# Patient Record
Sex: Female | Born: 1965
Health system: Southern US, Community
[De-identification: ages and names within clinical notes are randomized; demographics above are authoritative.]

## PROBLEM LIST (undated history)

## (undated) DIAGNOSIS — E7800 Pure hypercholesterolemia, unspecified: Secondary | ICD-10-CM

## (undated) DIAGNOSIS — F209 Schizophrenia, unspecified: Secondary | ICD-10-CM

## (undated) DIAGNOSIS — F17200 Nicotine dependence, unspecified, uncomplicated: Secondary | ICD-10-CM

## (undated) HISTORY — PX: BREAST CYST ASPIRATION: SHX578

---

## 2000-08-14 ENCOUNTER — Other Ambulatory Visit: Admission: RE | Admit: 2000-08-14 | Discharge: 2000-08-14 | Payer: Self-pay | Admitting: Family Medicine

## 2000-08-24 ENCOUNTER — Encounter: Payer: Self-pay | Admitting: Family Medicine

## 2000-08-24 ENCOUNTER — Ambulatory Visit (HOSPITAL_COMMUNITY): Admission: RE | Admit: 2000-08-24 | Discharge: 2000-08-24 | Payer: Self-pay | Admitting: Family Medicine

## 2001-01-01 ENCOUNTER — Encounter: Payer: Self-pay | Admitting: Family Medicine

## 2001-01-01 ENCOUNTER — Encounter: Admission: RE | Admit: 2001-01-01 | Discharge: 2001-01-01 | Payer: Self-pay | Admitting: Family Medicine

## 2001-08-25 ENCOUNTER — Observation Stay (HOSPITAL_COMMUNITY): Admission: AC | Admit: 2001-08-25 | Discharge: 2001-08-26 | Payer: Self-pay

## 2001-08-25 ENCOUNTER — Encounter: Payer: Self-pay | Admitting: Emergency Medicine

## 2001-10-30 ENCOUNTER — Inpatient Hospital Stay (HOSPITAL_COMMUNITY): Admission: EM | Admit: 2001-10-30 | Discharge: 2001-11-08 | Payer: Self-pay | Admitting: *Deleted

## 2002-08-29 ENCOUNTER — Inpatient Hospital Stay (HOSPITAL_COMMUNITY): Admission: EM | Admit: 2002-08-29 | Discharge: 2002-09-04 | Payer: Self-pay | Admitting: *Deleted

## 2002-10-18 ENCOUNTER — Inpatient Hospital Stay (HOSPITAL_COMMUNITY): Admission: EM | Admit: 2002-10-18 | Discharge: 2002-10-27 | Payer: Self-pay | Admitting: Psychiatry

## 2003-11-05 ENCOUNTER — Encounter: Payer: Self-pay | Admitting: Emergency Medicine

## 2003-11-06 ENCOUNTER — Inpatient Hospital Stay (HOSPITAL_COMMUNITY): Admission: EM | Admit: 2003-11-06 | Discharge: 2003-11-14 | Payer: Self-pay | Admitting: Psychiatry

## 2004-09-04 ENCOUNTER — Inpatient Hospital Stay (HOSPITAL_COMMUNITY): Admission: EM | Admit: 2004-09-04 | Discharge: 2004-09-13 | Payer: Self-pay | Admitting: Psychiatry

## 2004-09-04 ENCOUNTER — Ambulatory Visit: Payer: Self-pay | Admitting: Psychiatry

## 2006-03-03 ENCOUNTER — Emergency Department (HOSPITAL_COMMUNITY): Admission: EM | Admit: 2006-03-03 | Discharge: 2006-03-04 | Payer: Self-pay | Admitting: Emergency Medicine

## 2006-05-01 ENCOUNTER — Ambulatory Visit: Payer: Self-pay | Admitting: *Deleted

## 2006-05-01 ENCOUNTER — Inpatient Hospital Stay (HOSPITAL_COMMUNITY): Admission: AD | Admit: 2006-05-01 | Discharge: 2006-05-11 | Payer: Self-pay | Admitting: *Deleted

## 2007-01-28 ENCOUNTER — Emergency Department (HOSPITAL_COMMUNITY): Admission: EM | Admit: 2007-01-28 | Discharge: 2007-01-28 | Payer: Self-pay | Admitting: Emergency Medicine

## 2007-07-04 ENCOUNTER — Emergency Department (HOSPITAL_COMMUNITY): Admission: EM | Admit: 2007-07-04 | Discharge: 2007-07-05 | Payer: Self-pay | Admitting: Emergency Medicine

## 2008-12-26 ENCOUNTER — Emergency Department (HOSPITAL_COMMUNITY): Admission: EM | Admit: 2008-12-26 | Discharge: 2008-12-26 | Payer: Self-pay | Admitting: Emergency Medicine

## 2009-10-26 ENCOUNTER — Other Ambulatory Visit: Admission: RE | Admit: 2009-10-26 | Discharge: 2009-10-26 | Payer: Self-pay | Admitting: Family Medicine

## 2010-10-16 ENCOUNTER — Encounter: Payer: Self-pay | Admitting: Family Medicine

## 2011-02-10 NOTE — H&P (Signed)
[facility]  Patient:    [name] Visit Number: [id number] MRN: [id number]          Service Type: PSY Location: 400 [id number] Attending Physician:  Jeanice Lim Dictated by:   Candi Leash. Orsini, N.P. Admit Date:  10/30/2001                     Psychiatric Admission Assessment  DATE OF ADMISSION:  October 30, 2001  PATIENT IDENTIFICATION:  This is a 45 year old single white female voluntarily admitted for psychosis on October 30, 2001.  HISTORY OF PRESENT ILLNESS:  The patient presents with a history of psychotic behavior with increased delusions and positive paranoia.  [name] has moved in with her until she gets better.  The patient reports having "no thoughts," positive paranoid ideation she states especially around African-American people.  Reports she "lives for the Lord."  She denies depression, suicidal or homicidal ideation.  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  She denies any auditory or visual hallucinations.  Sleep and appetite have been fair.  PAST PSYCHIATRIC HISTORY:  First visit to [facility].  Sees Dr. Betti Cruz as an outpatient; phone number [phone].  Sees Maurine Simmering, a counselor at [facility]; phone number [phone].  SUBSTANCE ABUSE HISTORY:  The patient is a nonsmoker.  Denies any alcohol or substance abuse.  PAST MEDICAL HISTORY:  Primary care Arnesha Schiraldi: Dr. Sena Hitch.  Medical problems: None reported.  MEDICATIONS:  Unknown dosages: 1. Risperdal. 2. Topamax. 3. Lexapro.  DRUG ALLERGIES:  No known allergies.  PHYSICAL EXAMINATION:  GENERAL:  The patient presents as an obese, unkempt Caucasian female, 5 feet tall, 259 pounds.  VITAL SIGNS:  Temperature 98, respirations 18, blood pressure 120/77.  LABORATORY DATA:  WBC count elevated mildly at 11.0.  Albumin 3.4.  SOCIAL HISTORY:  A 45 year old single white female.  She has a  34 year old child.  She lives alone with her child.  She is unemployed, no work since 1993.  FAMILY HISTORY:  Unknown.  MENTAL STATUS EXAMINATION:  She is an alert, obese white female, unkempt, cooperative, guarded.  Speech is soft spoken with some thought blocking. Mood: Somewhat euphoric.  Affect is blunt.  Thought processes: Appears to be responding to internal stimuli, positive paranoia, positive religious preoccupation, positive delusions.  Cognitive: Intact.  Oriented to surroundings but unsure of the day of the week.  Judgment is poor.  Insight is poor.  ADMISSION DIAGNOSES: Axis I:    1. Psychosis, not otherwise specified.            2. Rule out schizophrenia. Axis II:   Deferred. Axis III:  None. Axis IV:   Moderate related to other psychosocial problems. Axis V:    Current is 25, estimated this past year is 55-60.  INITIAL PLAN OF CARE:  Plan is a voluntary admission to [facility] for psychosis.  Contract for safety.  Check every 15 minutes.  The patient will be placed on the 400 Hall for close monitoring.  Will resume her routine medications.  Will obtain labs.  Treatment plan was discussed with Dr. Kathrynn Running.  Will obtain CBC and consider placing the patient on Clozaril. Contact [name] for background information.  Goal is to stabilize mood and thinking so the patient can be safe, to follow up with Dr. Betti Cruz, be medication compliant.  ESTIMATED LENGTH OF STAY:  Five days or more depending on the  patients response to medications. Dictated by:   Candi Leash. Orsini, N.P. Attending Physician:  Jeanice Lim DD:  11/05/01 TD:  11/05/01 Job: [id number]
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
## 2011-02-10 NOTE — Discharge Summary (Signed)
NAMEJULETTA, [name]                           ACCOUNT NO.:  [id number]   MEDICAL RECORD NO.:  [id number]                   PATIENT TYPE:  IPS   LOCATION:  0403                                 FACILITY:  [facility]   PHYSICIAN:  Jeanice Lim, M.D.              DATE OF BIRTH:  Jun 15, 1966   DATE OF ADMISSION:  08/29/2002  DATE OF DISCHARGE:  09/04/2002                                 DISCHARGE SUMMARY   IDENTIFYING DATA:  This is a 45 year old single Caucasian female voluntarily  admitted with a history of self-inflicting injuries reporting to her  psychiatrist that she felt increased paranoid thoughts.  She had a past  history of schizophrenia.  She has been followed up by Dr. Betti Cruz.  Received  IM Prolixin.  Was ambivalent about these treatments and was quite delusional  at the time of admission.   MEDICATIONS:  Prolixin Decanoate every two weeks 25 mg and Cogentin 1 mg  b.i.d. and albuterol inhalers.   ALLERGIES:  No known drug allergies.   PHYSICAL EXAMINATION:  Essentially within normal limits.  Vital signs  stable.  Afebrile.  Neurologically nonfocal and intact.   LABORATORY DATA:  CBC within normal limits.  CMET within normal limits.   MENTAL STATUS EXAM:  Alert, young, middle-aged, cooperative female.  Guarded, duly paranoid with some thought-blocking, had somatic delusions,  appearing to respond to internal stimulus.  Denied suicidal or homicidal  ideation.  Cognitively intact.  Judgment and insight poor.   ADMISSION DIAGNOSES:   AXIS I:  Schizophrenia, paranoid-type.   AXIS II:  None.   AXIS III:  Asthma.   AXIS IV:  Moderate (problems with primary support group and limited  understanding into chronic mental illness).   AXIS V:  30/55.   HOSPITAL COURSE:  The patient was admitted and ordered routine p.r.n.  medications and underwent further monitoring.  The patient was encouraged to  participate in individual, group and milieu therapy.  Was resumed on  psychotropics and added Risperdal targeting psychotic symptoms.  Was  titrated, which patient tolerated well and she felt that her thinking became  more clear.  There was no longer preoccupation with delusional thoughts and  patient became less guarded and paranoid.  She participated in a family  meeting.  Aftercare planning was confirmed including intensive outpatient  follow-up.   CONDITION ON DISCHARGE:  Markedly improved.  Mood more euthymic.  Affect  brighter.  Thought processes goal directed.  Thought content negative for  dangerous ideation or psychotic symptoms.  The patient reported motivation  to be compliant with medications and there was no dangerous ideation at the  time of discharge.   DISCHARGE MEDICATIONS:  1. Cogentin 1 mg b.i.d.  2. Prolixin Decanoate 25 mg IM every 14 days.  3. Risperdal 3 mg q.h.s.  4. Ambien 10 mg q.h.s. p.r.n. insomnia.   FOLLOW UP:  Dr. Betti Cruz on September 22, 2002 and to follow up with IOP Monday,  September 08, 2002 at 9 a.m.   DISCHARGE DIAGNOSES:   AXIS I:  Schizophrenia, paranoid-type.   AXIS II:  None.   AXIS III:  Asthma.   AXIS IV:  Moderate (problems with primary support group and limited  understanding into chronic mental illness).   AXIS V:  Global Assessment of Functioning on discharge 50-55.                                               Jeanice Lim, M.D.    JEM/MEDQ  D:  09/05/2002  T:  09/06/2002  Job:  [id number]

## 2011-02-10 NOTE — H&P (Signed)
NAMECYNTHYA, [name]                           ACCOUNT NO.:  [id number]   MEDICAL RECORD NO.:  [id number]                   PATIENT TYPE:  IPS   LOCATION:  0403                                 FACILITY:  [facility]   PHYSICIAN:  Jeanice Lim, M.D.              DATE OF BIRTH:  13-Oct-1965   DATE OF ADMISSION:  08/29/2002  DATE OF DISCHARGE:                         PSYCHIATRIC ADMISSION ASSESSMENT   IDENTIFYING INFORMATION:  A 45 year old single white female, voluntarily  admitted on August 29, 2002.   HISTORY OF PRESENT ILLNESS:  The patient presents with a history of self-  inflicted injuries.  She cut her wrist last week, states she did it to see  how much cutting she could do before it would hurt.  She apparently had  told her psychiatrist who advised admission for evaluation.  The patient  reports that she has been experiencing increased paranoid thoughts prior to  this admission, but then states she went to church and got hope.  She  denies any specific stressors.  She states her appetite has been  satisfactory, has been compliant with her medication and feels that she does  not need to be here.   PAST PSYCHIATRIC HISTORY:  First admission to [facility].  Sees Dr. Betti Cruz and Kenton Kingfisher who is her therapist.   SOCIAL HISTORY:  She is a 45 year old single white female.  She lives with  her father and her son.  She is on disability.  She reports no legal  problems.   FAMILY HISTORY:  Unknown.   ALCOHOL DRUG HISTORY:  Denies any drug or alcohol use.   PAST MEDICAL HISTORY:  Primary care Graciela Plato is Dr. Arvilla Market.  Medical  problems are none.   MEDICATIONS:  Prolixin Decanoate 1 cc every 2 weeks, gets her injections on  Wednesday, Cogentin 1 mg b.i.d. and albuterol inhalers.   DRUG ALLERGIES:  No known allergies.   PHYSICAL EXAMINATION:  The patient's vital signs:  97, 77 heart rate, 20  respirations, blood pressure is 125/78.  GENERAL:  The patient  is a  45 year old Caucasian female in no acute  distress.  HEAD:  Normocephalic.  Hair is long, brown and clean.  Able to protrude her  tongue without tremor.  Able to raise her eyebrows.  No sinus tenderness or  nasal discharge.  Dentition is fair.  NECK:  Negative lymphadenopathy.  CHEST:  Clear to auscultation.  HEART:  Regular rate and rhythm.  BREAST EXAM:  Deferred.  ABDOMEN:  Soft, nontender abdomen.  No CVA tenderness.  MUSCULOSKELETAL:  Muscle strength and tone is equal bilaterally.  No signs  of injury.  SKIN:  Warm and dry.  The patient has a laceration to her left wrist which  is about 1 inch long, no drainage noted.  NEUROLOGIC:  Oriented x3.  Cranial nerves are grossly intact.  The patient  is able to perform heel-to-shin and  normal alternating movements without  difficulty.   LABORATORY DATA:  CBC is 11.1, RBC is 5.38, hemoglobin is 16.1, hematocrit  47.4.  CMET is within normal limits.   MENTAL STATUS EXAM:  She is an alert, young middle-aged cooperative female,  guarded, offered little information and nonspecific information.  Speech is  thought blocking.  Her affect is constricted.  Thought processes:  The  patient appears to be responding to internal stimuli.  No visual  hallucinations or paranoid ideation, no suicidal or homicidal ideation.  Cognitive functioning intact.  Memory is poor, judgment and insight are  poor.  The patient is a poor and questionable historian.   ADMISSION DIAGNOSES:   AXIS I:  Schizophrenia, paranoid type.   AXIS II:  Deferred.   AXIS III:  Asthma.   AXIS IV:  Problems with primary support group and other psychosocial  problems.   AXIS V:  Current is 30, this past year 55-60.   PLAN:  Voluntary admission for self-inflicted injury.  Contract for safety,  check every 15 minutes.  The patient will be placed on the 400 hall for  close monitoring.  We will resume her medications, will add Risperdal and  Zyprexa for psychotic symptoms.   Will contact family for background  information.  Will observe the wound for signs and symptoms of infection.  The patient to be medication compliant.  To stabilize her mood and thinking  so the patient can be safe, to follow up with Dr. Betti Cruz and Kenton Kingfisher,  therapist.   TENTATIVE LENGTH OF CARE:  4-6 days.       Landry Corporal, N.P.                       Jeanice Lim, M.D.    JO/MEDQ  D:  09/02/2002  T:  09/02/2002  Job:  [id number]

## 2011-02-10 NOTE — H&P (Signed)
[name], [name]                           ACCOUNT NO.:  [id number]   MEDICAL RECORD NO.:  [id number]                   PATIENT TYPE:  IPS   LOCATION:  0406                                 FACILITY:  [facility]   PHYSICIAN:  Jeanice Lim, M.D.              DATE OF BIRTH:  1966-01-11   DATE OF ADMISSION:  10/18/2002  DATE OF DISCHARGE:                         PSYCHIATRIC ADMISSION ASSESSMENT   PATIENT IDENTIFICATION:  This is a 45 year old single white female with a 17-  year-old son who was voluntarily admitted.   HISTORY OF PRESENT ILLNESS:  She is expressing increasing suicidal ideation  with a plan to walk in front of a truck.  The patient was brought to the  [facility] Emergency Room yesterday by a neighbor after telling  the neighbor she had these ideas.  The patient, today, reports becoming  despondent after her father told her he was going to have to go out of town  for work and is going to leave her brother with her and her son.  The  patient spontaneously reports planning to overdose on pills at age 61, is  frequently thought blocking and tangential.   PAST PSYCHIATRIC HISTORY:  She has had two prior [facility] admissions, one in February 2003, one in December 2003.  She  acknowledges beginning treatment for mental illness at age 21.   SUBSTANCE ABUSE HISTORY:  She denies any issues with alcohol or drugs.   PAST MEDICAL HISTORY:  Primary care Nain Rudd is Donia Guiles, M.D.  She  has medical conditions of allergies and asthma.  She is given Allegra for  her allergies and albuterol inhaler for her asthma.   MEDICATIONS:  She is currently under the care of Daine Floras, M.D.  She receives:  1. Prolixin Decanoate 25 mg every 14 days.  She is next due for such on     October 29, 2002.  2. Risperdal 3 mg at h.s.  3. Cogentin 1 mg b.i.d.  4. Ambien 10 mg p.r.n.   DRUG ALLERGIES:  The patient denies any drug allergies.   PHYSICAL EXAMINATION:  GENERAL:  The patient is obese; otherwise, she had no  remarkable physical findings.   SOCIAL HISTORY:  She states that she tried to attend several colleges and  also went to [employer].  She was found to be disabled in 1993.  Her father and son live with her in an apartment.   FAMILY HISTORY:  She says that her 47 year old son has an understanding  problem.  Her father gets depressed about her condition but otherwise,  disavows any knowledge of other family members with mental illness.   MENTAL STATUS EXAM:  She was obese.  She was casual.  She was calm.  Her  mood and affect were flat.  She had poor eye contact.  Her speech was slow.  She could be  redirected and answer appropriately.  She was somewhat sedated.  She was also exhibiting thought blocking.  She was, however, alert and  oriented x 3.  She could show that her thoughts were clear and goal oriented  at times.  Judgment and insight were poor.    ADMISSION DIAGNOSES:   AXIS I:  Paranoid schizophrenia; probable noncompliance with p.o.  medications.   AXIS II:  Deferred.   AXIS III:  1. Obesity.  2. Asthma.   AXIS IV:  She denies.   AXIS V:  Her global assessment of functioning at the moment is about 35 and  perhaps, in the past year has been as good as 60.   INITIAL PLAN OF CARE:  The plan is to treat her suicidal ideation and  hallucinations and to help her comply with her p.o. medications.   ESTIMATED LENGTH OF STAY:  Tentative length of stay is three to four days.     Jeanice Lim, M.D.                    Jeanice Lim, M.D.    JEM/MEDQ  D:  10/19/2002  T:  10/19/2002  Job:  [id number]

## 2011-02-10 NOTE — Discharge Summary (Signed)
[name], [name]                           ACCOUNT NO.:  [id number]   MEDICAL RECORD NO.:  [id number]                   PATIENT TYPE:  IPS   LOCATION:  0406                                 FACILITY:  [facility]   PHYSICIAN:  Jeanice Lim, M.D.              DATE OF BIRTH:  [birth year]   DATE OF ADMISSION:  10/18/2002  DATE OF DISCHARGE:  10/27/2002                                 DISCHARGE SUMMARY   IDENTIFYING DATA:  This is a 45 year old single Caucasian female with a 74-  year-old son voluntarily admitted with increasing suicidal ideation with  plan to walk in front of a truck.   MEDICATIONS:  Prolixin Decanoate, Risperdal, Cogentin and Ambien.   ALLERGIES:  No known drug allergies.   PHYSICAL EXAMINATION:  Essentially within normal limits.  Neurologically  nonfocal.   LABORATORY DATA:  Routine admission labs essentially within normal limits.   MENTAL STATUS EXAM:  Obese, mostly calm female.  Mood and affect were flat,  guarded.  The patient was somewhat sedated and exhibiting thought-blocking.  Otherwise cognition was intact.  Judgment and insight were poor.   ADMISSION DIAGNOSES:   AXIS I:  Paranoid schizophrenia, chronic, with a long history of  noncompliance with medications.   AXIS II:  None.   AXIS III:  1. Obesity.  2. Asthma.   AXIS IV:  Moderate (problems with primary support group).   AXIS V:  35/60.   HOSPITAL COURSE:  The patient was admitted and ordered routine p.r.n.  medications and underwent further monitoring.  Was encouraged to participate  in individual, group and milieu therapy.  The patient was resumed on  Cogentin, Prolixin, Risperdal, Ambien, albuterol inhaler and started on  Lexapro and Risperdal p.o.  Lexapro was optimized targeting depressive  symptoms.  The patient had a family meeting to discuss compliance issues and  evaluate support system.  The patient was further optimized on p.o.  Risperdal and given Prolixin Decanoate prior to  discharge.  She reported  positive response to clinical intervention.   CONDITION ON DISCHARGE:  Improved.  Mood was more euthymic.  Affect  brighter.  Thought processes goal directed.  Thought content negative for  dangerous ideation or psychotic symptoms.  The patient reported motivation  to be compliant with the aftercare plan.    DISCHARGE MEDICATIONS:  1. Prolixin Decanoate 25 mg every two weeks.  Last given on October 27, 2002.  2. Cogentin 1 mg b.i.d.  3. Risperdal 0.5 mg t.i.d. and 3 mg q.h.s.  4. Lexapro 10 mg q.a.m.   FOLLOW UP:  The patient was to follow up with psychiatric counseling on  November 04, 2002 at 1:30 p.m. with  Dr. Betti Cruz and with therapist on November 11, 2002.   DISCHARGE DIAGNOSES:   AXIS I:  Paranoid schizophrenia, chronic, with a long history of  noncompliance with medications.   AXIS II:  None.   AXIS III:  1. Obesity.  2. Asthma.   AXIS IV:  Moderate (problems with primary support group).   AXIS V:  Global Assessment of Functioning on discharge 50.                                               Jeanice Lim, M.D.    JEM/MEDQ  D:  11/26/2002  T:  11/27/2002  Job:  [id number]
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
## 2011-07-06 LAB — DIFFERENTIAL
Basophils Relative: 1
Eosinophils Absolute: 0.2
Neutro Abs: 8.8 — ABNORMAL HIGH
Neutrophils Relative %: 70

## 2011-07-06 LAB — RAPID URINE DRUG SCREEN, HOSP PERFORMED
Amphetamines: NOT DETECTED
Barbiturates: NOT DETECTED
Benzodiazepines: NOT DETECTED
Cocaine: NOT DETECTED
Opiates: NOT DETECTED
Tetrahydrocannabinol: NOT DETECTED

## 2011-07-06 LAB — URINALYSIS, ROUTINE W REFLEX MICROSCOPIC
Glucose, UA: NEGATIVE
Ketones, ur: NEGATIVE
Leukocytes, UA: NEGATIVE
Nitrite: NEGATIVE
Protein, ur: NEGATIVE
Urobilinogen, UA: 0.2

## 2011-07-06 LAB — CBC
HCT: 44.7
Hemoglobin: 15.2 — ABNORMAL HIGH
MCHC: 34
RBC: 5.4 — ABNORMAL HIGH

## 2011-07-06 LAB — URINE MICROSCOPIC-ADD ON

## 2011-07-06 LAB — COMPREHENSIVE METABOLIC PANEL
ALT: 23
Alkaline Phosphatase: 65
BUN: 9
CO2: 22
Calcium: 8.4
GFR calc non Af Amer: >60
Glucose, Bld: 102 — ABNORMAL HIGH
Sodium: 132 — ABNORMAL LOW

## 2011-12-21 ENCOUNTER — Other Ambulatory Visit: Payer: Self-pay | Admitting: Family Medicine

## 2011-12-21 DIAGNOSIS — Z1231 Encounter for screening mammogram for malignant neoplasm of breast: Secondary | ICD-10-CM

## 2012-01-23 ENCOUNTER — Ambulatory Visit
Admission: RE | Admit: 2012-01-23 | Discharge: 2012-01-23 | Disposition: A | Discharge status: Home / Self Care | Payer: Medicaid Other | Source: Ambulatory Visit | Attending: Family Medicine | Admitting: Family Medicine

## 2012-01-23 DIAGNOSIS — Z1231 Encounter for screening mammogram for malignant neoplasm of breast: Secondary | ICD-10-CM

## 2012-03-10 ENCOUNTER — Emergency Department (HOSPITAL_COMMUNITY)
Admission: EM | Admit: 2012-03-10 | Discharge: 2012-03-11 | Disposition: A | Discharge status: Home / Self Care | Payer: Medicare Other | Attending: Emergency Medicine | Admitting: Emergency Medicine

## 2012-03-10 ENCOUNTER — Encounter (HOSPITAL_COMMUNITY): Payer: Self-pay | Admitting: *Deleted

## 2012-03-10 DIAGNOSIS — F200 Paranoid schizophrenia: Secondary | ICD-10-CM

## 2012-03-10 HISTORY — DX: Schizophrenia, unspecified: F20.9

## 2012-03-10 HISTORY — DX: Pure hypercholesterolemia, unspecified: E78.00

## 2012-03-10 HISTORY — DX: Nicotine dependence, unspecified, uncomplicated: F17.200

## 2012-03-10 LAB — ETHANOL: Alcohol, Ethyl (B): <11 mg/dL (ref 0–11)

## 2012-03-10 LAB — CBC
Platelets: 218 10*3/uL (ref 150–400)
RDW: 13.5 % (ref 11.5–15.5)
WBC: 9.5 10*3/uL (ref 4.0–10.5)

## 2012-03-10 LAB — COMPREHENSIVE METABOLIC PANEL
ALT: 14 U/L (ref 0–35)
AST: 18 U/L (ref 0–37)
CO2: 23 mEq/L (ref 19–32)
Calcium: 9.2 mg/dL (ref 8.4–10.5)
Chloride: 101 mEq/L (ref 96–112)
GFR calc non Af Amer: 85 mL/min — ABNORMAL LOW (ref >90)
Potassium: 3.5 mEq/L (ref 3.5–5.1)
Sodium: 136 mEq/L (ref 135–145)
Total Bilirubin: 0.3 mg/dL (ref 0.3–1.2)

## 2012-03-10 LAB — DIFFERENTIAL
Basophils Absolute: 0 10*3/uL (ref 0.0–0.1)
Lymphocytes Relative: 19 % (ref 12–46)
Neutro Abs: 7 10*3/uL (ref 1.7–7.7)
Neutrophils Relative %: 74 % (ref 43–77)

## 2012-03-10 MED ORDER — NICOTINE 21 MG/24HR TD PT24
21.0000 mg | MEDICATED_PATCH | Freq: Every day | TRANSDERMAL | Status: DC
  Administered 2012-03-10 – 2012-03-11 (×2): 21 mg via TRANSDERMAL
  Filled 2012-03-10 (×2): qty 1

## 2012-03-10 MED ORDER — RISPERIDONE 2 MG PO TABS
6.0000 mg | ORAL_TABLET | Freq: Every day | ORAL | Status: DC
  Administered 2012-03-10: 6 mg via ORAL
  Filled 2012-03-10: qty 3

## 2012-03-10 MED ORDER — FLUPHENAZINE DECANOATE 25 MG/ML IJ SOLN: 25.0000 mg | Freq: Once | INTRAMUSCULAR | Status: DC

## 2012-03-10 MED ORDER — ZIPRASIDONE MESYLATE 20 MG IM SOLR
20.0000 mg | Freq: Once | INTRAMUSCULAR | Status: AC
  Administered 2012-03-10: 20 mg via INTRAMUSCULAR
  Filled 2012-03-10: qty 20

## 2012-03-10 MED ORDER — ACETAMINOPHEN 325 MG PO TABS: 650.0000 mg | ORAL_TABLET | ORAL | Status: DC | PRN

## 2012-03-10 MED ORDER — LORAZEPAM 1 MG PO TABS: 1.0000 mg | ORAL_TABLET | Freq: Three times a day (TID) | ORAL | Status: DC | PRN

## 2012-03-10 NOTE — ED Notes (Addendum)
Information per [name] at group home-phone # [phone].   Pt has a guardian at [employer]  Per Ms [name] the patient has been at the group home since 2009 and has been able to function well.  She reports that the pt always has a degree of delusional behaviors that fluctuate .  She reports that the patients brother died several months ago and that the patients behavior started deteriorating and she  has become more hostile/agressive/angry/delusional and that the episodes are lasting  Longer..  The patients MD is aware and added po prolixin to her medication which the patient has refused to take.  The patient is getting the prolixin injections and does take her risperidal at night.She also reports that the patient does not do well when she goes to her home for visits and that they have been limiting her visits to several hours on the week ends.  Ms [name] is requesting that the patient be sent to [facility].  She reported that prior to 2009 the patient was at [facility] for about 1 yr and that they were able to get the patients medication adjusted.  Ms [name] requested to be updated with the patients disposition.
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
## 2012-03-10 NOTE — ED Notes (Signed)
Up to the bathroom, ambulatory w/o difficulty 

## 2012-03-10 NOTE — ED Notes (Signed)
Pt refuses to change into scrubs stating "I'm not staying here, I'm not taking my clothes off, there's nothing wrong with me"; [employer] remains @ BS.

## 2012-03-10 NOTE — ED Notes (Signed)
Sitting on the side of the bed eatting, nad

## 2012-03-10 NOTE — ED Notes (Signed)
Pt placed in scrubs and wanded by security. Pt escorted to psych with security and [employer]. Pt has belonging bag x1
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
## 2012-03-11 DIAGNOSIS — F200 Paranoid schizophrenia: Secondary | ICD-10-CM

## 2012-03-11 LAB — RAPID URINE DRUG SCREEN, HOSP PERFORMED
Barbiturates: NOT DETECTED
Benzodiazepines: NOT DETECTED

## 2012-03-11 LAB — URINALYSIS, ROUTINE W REFLEX MICROSCOPIC
Ketones, ur: NEGATIVE mg/dL
Leukocytes, UA: NEGATIVE
Nitrite: NEGATIVE
pH: 6 (ref 5.0–8.0)

## 2012-03-11 LAB — URINE MICROSCOPIC-ADD ON

## 2012-03-11 MED ORDER — FLUPHENAZINE DECANOATE 25 MG/ML IJ SOLN
25.0000 mg | Freq: Once | INTRAMUSCULAR | Status: AC
  Administered 2012-03-11: 25 mg via INTRAMUSCULAR
  Filled 2012-03-11 (×2): qty 1

## 2012-03-11 NOTE — Discharge Instructions (Signed)
[name] was evaluated by telepsych at the [facility].  It is safe to send her back to the Group Home with no change of medications.  Follow up with PCP/psych  this week.  Schizophrenia Schizophrenia is a serious mental illness. There is disturbed and disorganized thinking, language, and behavior. Patients may see, hear, or feel things that are not really there. Sometimes speech is incoherent and there are multiple problems with day to day living. Schizophrenia should not be confused with multiple personality disorder (now called dissociative identity disorder) in which a person has at least two distinct personalities. About 1% of people have schizophrenia in their lifetimes. It affects men and women equally. CAUSES  There are many theories about the cause of schizophrenia. The genes a person inherits from his or her parents may be partially responsible. Stress in a person's environment can trigger episodes. A person may have functioned normally for years and then have an acute (sudden) psychotic episode caused by stress. Some scientists believe that something might happen before birth, such as a viral infection in the womb, that causes schizophrenic symptoms (problems) decades later. Special scans, such as PET (positron-emission tomography) have been used to look at the brains of people with this illness. These pictures show that some parts of the brain seem to have metabolic or chemical abnormalities. Lab studies have shown that nerve cells in some parts of the brains of schizophrenics may be uneven or damaged. Another possible cause is that chemicals carrying signals between nerve cells may not be working. Schizophrenia does not appear to be caused by family problems. Stress does appear to make things worse for people with this illness. SYMPTOMS No single symptom defines this condition. Important signs are:  Hallucinations (hearing voices or seeing things that are not there to hear or see).    Dressing inappropriately.   Neglecting personal hygiene and grooming.   Withdrawing from social contacts and not speaking to anybody (autism).   Inability to understand what a schizophrenic is saying.   A growing distrust of people without good cause.   Being very bland or blunted emotionally (flat affect).  TYPES OF SCHIZOPHRENIA  Paranoid schizophrenia involves delusional thoughts. The patient believes people around them are against them and plotting against them.   In grandiose schizophrenia the patient may feel that he is God, or the President, etc.   Disorganized schizophrenia involves symptoms of disorganized speech.  TREATMENT  Medications are the most important part of the treatment of schizophrenia. Many medications are available that can relieve symptoms. It is often helpful if these can be administered by a more trusted family member because the patient may sometimes think they are being poisoned. It is important that the medications be given regularly even when the patient seems to be doing well. Do not stop giving medications without instruction by a caregiver. This could lead to a relapse. Hospitalization may sometimes be necessary if symptoms cannot be controlled with medications. Schizophrenia may be lifelong. However, periods of illness may be inter spaced with long periods of normality. Medications can greatly improve the quality of life. Non prescribed drugs and alcohol should be avoided.  Assistance is available for care. The [employer] is an organization of family members of people with severe mental illness. They direct families and patients to support groups, education, and advocacy programs for additional help. [employer]) toll-free help line number is 800/950-NAMI, or 213-451-6582. Document Released: 09/08/2000 Document  Revised: 08/31/2011 Document Reviewed: 09/11/2005 [facility] Patient Information  2012 [place], [place].
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
## 2012-03-11 NOTE — Progress Notes (Signed)
Discussed case with RN.  Per RN, Pt has been cleared by psych and MD for d/c today.  CSW met with Pt to discuss d/c plan.  Pt was labile and had difficulty remaining on topic.  Pt states repeatedly that she does not want to return to her group home, as they are trying to kill her with medication.  She states that she only wants to take a fish oil pill but that the home insists that she take other meds.  CSW attempted to call Pt's mom, as Pt stated that her mom will allow her to return.  Unable to leave a message, as "mailbox full."  CSW called [name] at [facility] group home.  Mrs. [name] stating that the group home will not accept Pt unless she verbally agrees to take her medication.  Mrs. [name] asked that CSW not use the word "take" when talking with Pt about her meds, rather, Pt responds to "swallow."  CSW discussed with Pt Mrs. Allen's concern about Pt swallowing her medications.  Pt became upset and stated that she's not going to take anything other that fish oil.  Further, Pt stated that she's not going back to Mrs. Allen's care.    CSW to attempt to call DSS guardian.  CSW to continue to follow.  Providence Crosby, LCSWA Clinical Social Work [phone]

## 2012-03-11 NOTE — ED Notes (Signed)
Pt refused to sign discharge papers 
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
## 2012-03-11 NOTE — Progress Notes (Addendum)
CSW was consulted by unit psychiatrist to assist in facilitating pt's return to her group home. Per unit psychiatrist, pt has now received 2 psychiatry consults both of which recommend discharge home and noting that there is no criteria for inpatient hospitalization.   CSW contacted [name] [phone]), [facility] owner, who expressed concerns regarding pt's behaviors. [name] stated that she feels as though the pt is not at her baseline and is concerned with her returning to the group home, though is not stating that the pt cannot return home. Allen requested to speak with the psychiatrist who recommended discharge. CSW spoke with Dr. Carmelina Dane and requested that he talk with [name] at the group home.   CSW also contacted Roxana Hires [phone]), pt's DSS guardian, to discuss discharge plans. At this time, guardian expressed concerns that the pt is not at her baseline though understands that if psychiatrists have evaluated her and recommend discharge home, she will need to be discharged. Reuel Derby supports the pt's discharge back to the group home if pt is discharged this evening.   CSW also spoke with Court Trueblood [phone]), [facility] ACT team member, who stated that he has worked with the pt for a number of years and she has some aggressive behaviors even at baseline. Court requested to speak with the psychiatrist regarding recommendations. CSW was able to transfer the call to Dr. Carmelina Dane, unit psychiatrist.   CSW spoke with unit psychiatrist and EDP regarding community member's concerns with pt's discharge. Per EDP and unit psychiatrist, pt was still to be discharged home.
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
## 2012-03-11 NOTE — Progress Notes (Signed)
CSW spoke with [name] who is willing to accept pt back at this time. Allen requested that she receives her Prolixin  before discharge. EDP was willing to provide the injection of Prolixin but no the PRN med. [name] was updated and still agrees the pt is able to discharge back however if unable to provide transportation.   CSW contacted Court with [facility] who states that he will provide the pt with transportation and will arrive between 7:30-8:00pm.

## 2012-03-12 NOTE — ED Provider Notes (Signed)
Medical screening examination/treatment/procedure(s) were performed by non-physician practitioner and as supervising physician I was immediately available for consultation/collaboration.  Toy Baker, MD 03/12/12 [phone]

## 2012-06-05 NOTE — ED Provider Notes (Signed)
Medical screening examination/treatment/procedure(s) were performed by non-physician practitioner and as supervising physician I was immediately available for consultation/collaboration.  Toy Baker, MD 06/05/12 2023

## 2013-02-11 ENCOUNTER — Other Ambulatory Visit: Payer: Self-pay | Admitting: Family Medicine

## 2013-02-11 DIAGNOSIS — Z1231 Encounter for screening mammogram for malignant neoplasm of breast: Secondary | ICD-10-CM

## 2013-02-14 ENCOUNTER — Ambulatory Visit: Payer: Medicare Other

## 2013-02-19 ENCOUNTER — Ambulatory Visit
Admission: RE | Admit: 2013-02-19 | Discharge: 2013-02-19 | Disposition: A | Discharge status: Home / Self Care | Payer: Medicare Other | Source: Ambulatory Visit | Attending: Family Medicine | Admitting: Family Medicine

## 2013-02-19 DIAGNOSIS — Z1231 Encounter for screening mammogram for malignant neoplasm of breast: Secondary | ICD-10-CM

## 2014-04-02 ENCOUNTER — Other Ambulatory Visit: Payer: Self-pay

## 2014-04-02 ENCOUNTER — Other Ambulatory Visit: Payer: Self-pay | Admitting: Family Medicine

## 2014-04-02 DIAGNOSIS — Z1231 Encounter for screening mammogram for malignant neoplasm of breast: Secondary | ICD-10-CM

## 2014-04-16 ENCOUNTER — Ambulatory Visit
Admission: RE | Admit: 2014-04-16 | Discharge: 2014-04-16 | Disposition: A | Discharge status: Home / Self Care | Payer: Medicare Other | Source: Ambulatory Visit | Attending: Family Medicine | Admitting: Family Medicine

## 2014-04-16 DIAGNOSIS — Z1231 Encounter for screening mammogram for malignant neoplasm of breast: Secondary | ICD-10-CM

## 2015-06-17 ENCOUNTER — Other Ambulatory Visit: Payer: Self-pay

## 2015-06-17 DIAGNOSIS — Z1231 Encounter for screening mammogram for malignant neoplasm of breast: Secondary | ICD-10-CM

## 2015-06-22 ENCOUNTER — Ambulatory Visit
Admission: RE | Admit: 2015-06-22 | Discharge: 2015-06-22 | Disposition: A | Discharge status: Home / Self Care | Payer: Medicaid Other | Source: Ambulatory Visit

## 2015-06-22 DIAGNOSIS — Z1231 Encounter for screening mammogram for malignant neoplasm of breast: Secondary | ICD-10-CM

## 2015-06-25 ENCOUNTER — Other Ambulatory Visit: Payer: Self-pay

## 2015-06-25 ENCOUNTER — Ambulatory Visit
Admission: RE | Admit: 2015-06-25 | Discharge: 2015-06-25 | Disposition: A | Discharge status: Home / Self Care | Payer: Medicaid Other | Source: Ambulatory Visit

## 2015-06-25 ENCOUNTER — Ambulatory Visit: Payer: Medicaid Other

## 2015-06-25 DIAGNOSIS — Z1231 Encounter for screening mammogram for malignant neoplasm of breast: Secondary | ICD-10-CM

## 2015-10-20 ENCOUNTER — Other Ambulatory Visit: Payer: Self-pay | Admitting: Family Medicine

## 2015-10-20 ENCOUNTER — Other Ambulatory Visit (HOSPITAL_COMMUNITY)
Admission: RE | Admit: 2015-10-20 | Discharge: 2015-10-20 | Disposition: A | Discharge status: Home / Self Care | Payer: Medicare Other | Source: Ambulatory Visit | Attending: Family Medicine | Admitting: Family Medicine

## 2015-10-20 DIAGNOSIS — Z113 Encounter for screening for infections with a predominantly sexual mode of transmission: Secondary | ICD-10-CM | POA: Diagnosis present

## 2015-10-20 DIAGNOSIS — Z1151 Encounter for screening for human papillomavirus (HPV): Secondary | ICD-10-CM | POA: Insufficient documentation

## 2015-10-20 DIAGNOSIS — N760 Acute vaginitis: Secondary | ICD-10-CM | POA: Insufficient documentation

## 2015-10-20 DIAGNOSIS — Z124 Encounter for screening for malignant neoplasm of cervix: Secondary | ICD-10-CM | POA: Diagnosis present

## 2015-10-25 LAB — CYTOLOGY - PAP

## 2015-11-29 ENCOUNTER — Emergency Department (HOSPITAL_COMMUNITY)
Admission: EM | Admit: 2015-11-29 | Discharge: 2015-11-30 | Payer: Medicare Other | Attending: Emergency Medicine | Admitting: Emergency Medicine

## 2015-11-29 ENCOUNTER — Encounter (HOSPITAL_COMMUNITY): Payer: Self-pay | Admitting: Emergency Medicine

## 2015-11-29 DIAGNOSIS — F911 Conduct disorder, childhood-onset type: Secondary | ICD-10-CM | POA: Diagnosis not present

## 2015-11-29 DIAGNOSIS — F22 Delusional disorders: Secondary | ICD-10-CM | POA: Insufficient documentation

## 2015-11-29 DIAGNOSIS — F17200 Nicotine dependence, unspecified, uncomplicated: Secondary | ICD-10-CM | POA: Diagnosis not present

## 2015-11-29 DIAGNOSIS — Z79899 Other long term (current) drug therapy: Secondary | ICD-10-CM | POA: Insufficient documentation

## 2015-11-29 DIAGNOSIS — Z008 Encounter for other general examination: Secondary | ICD-10-CM | POA: Diagnosis present

## 2015-11-29 DIAGNOSIS — E7800 Pure hypercholesterolemia, unspecified: Secondary | ICD-10-CM | POA: Insufficient documentation

## 2015-11-29 LAB — CBC
HEMATOCRIT: 45.9 % (ref 36.0–46.0)
HEMOGLOBIN: 15.2 g/dL — AB (ref 12.0–15.0)
MCH: 30 pg (ref 26.0–34.0)
MCHC: 33.1 g/dL (ref 30.0–36.0)
MCV: 90.7 fL (ref 78.0–100.0)
Platelets: 185 10*3/uL (ref 150–400)
RBC: 5.06 MIL/uL (ref 3.87–5.11)
RDW: 13.7 % (ref 11.5–15.5)
WBC: 9.2 10*3/uL (ref 4.0–10.5)

## 2015-11-29 LAB — COMPREHENSIVE METABOLIC PANEL
ALBUMIN: 4.3 g/dL (ref 3.5–5.0)
ALT: 15 U/L (ref 14–54)
ANION GAP: 9 (ref 5–15)
AST: 20 U/L (ref 15–41)
Alkaline Phosphatase: 62 U/L (ref 38–126)
BUN: 10 mg/dL (ref 6–20)
CHLORIDE: 108 mmol/L (ref 101–111)
CO2: 24 mmol/L (ref 22–32)
Calcium: 9.1 mg/dL (ref 8.9–10.3)
Creatinine, Ser: 0.74 mg/dL (ref 0.44–1.00)
GFR calc non Af Amer: >60 mL/min (ref >60)
GLUCOSE: 93 mg/dL (ref 65–99)
POTASSIUM: 3.9 mmol/L (ref 3.5–5.1)
SODIUM: 141 mmol/L (ref 135–145)
Total Bilirubin: 0.2 mg/dL — ABNORMAL LOW (ref 0.3–1.2)
Total Protein: 7 g/dL (ref 6.5–8.1)

## 2015-11-29 LAB — ETHANOL: Alcohol, Ethyl (B): <5 mg/dL (ref <5)

## 2015-11-29 LAB — ACETAMINOPHEN LEVEL

## 2015-11-29 LAB — SALICYLATE LEVEL

## 2015-11-29 MED ORDER — NICOTINE 21 MG/24HR TD PT24
21.0000 mg | MEDICATED_PATCH | Freq: Every day | TRANSDERMAL | Status: DC
  Administered 2015-11-30: 21 mg via TRANSDERMAL
  Filled 2015-11-29: qty 1

## 2015-11-29 MED ORDER — ACETAMINOPHEN 325 MG PO TABS: 325.0000 mg | ORAL_TABLET | Freq: Four times a day (QID) | ORAL | Status: DC | PRN

## 2015-11-29 MED ORDER — ZIPRASIDONE MESYLATE 20 MG IM SOLR: 10.0000 mg | Freq: Once | INTRAMUSCULAR | Status: DC

## 2015-11-29 MED ORDER — ALUM & MAG HYDROXIDE-SIMETH 200-200-20 MG/5ML PO SUSP: 30.0000 mL | ORAL | Status: DC | PRN

## 2015-11-29 MED ORDER — RISPERIDONE 2 MG PO TABS
6.0000 mg | ORAL_TABLET | Freq: Every day | ORAL | Status: DC
  Administered 2015-11-29: 6 mg via ORAL
  Filled 2015-11-29: qty 3
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
## 2015-11-29 NOTE — ED Notes (Signed)
Pt refused blood draw

## 2015-11-29 NOTE — ED Notes (Signed)
[facility] called, [name] [phone]. She informs me that [place] [facility] [facility].
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
## 2015-11-29 NOTE — ED Notes (Signed)
Patient presents for medical clearance. Case manager originally called [employer] for medical clearance.

## 2015-11-29 NOTE — ED Notes (Signed)
[facility] Expected date: 11/29/15 Expected time: 9:06 PM Means of arrival:  Comments:

## 2015-11-29 NOTE — ED Notes (Signed)
Bed: [facility] Expected date:  Expected time:  Means of arrival:  Comments: [employer]

## 2015-11-30 DIAGNOSIS — F22 Delusional disorders: Secondary | ICD-10-CM | POA: Diagnosis not present

## 2015-11-30 LAB — RAPID URINE DRUG SCREEN, HOSP PERFORMED
AMPHETAMINES: NOT DETECTED
BENZODIAZEPINES: NOT DETECTED
Barbiturates: NOT DETECTED
Cocaine: NOT DETECTED
OPIATES: NOT DETECTED
Tetrahydrocannabinol: NOT DETECTED

## 2015-11-30 NOTE — [facility] Counselor (Signed)
Counselor faxed out supporting documentation to the following psychiatric facilities for patient placement: [facility]   Adelaido Nicklaus McNeil, MA 

## 2015-11-30 NOTE — ED Notes (Signed)
[facility]  All belongings were returned to pt.  Pt was in no distress at discharge.

## 2015-11-30 NOTE — BH Assessment (Signed)
[facility] to their [facility] [place] [facility] by Dr. Melinda CrutchGacengeci. Number for nursing report is [phone]. Pt can be transported at any time. Notified Dr. Gerhard Munchobert Lockwood and Carrolyn LeighMegan Rhodes, RN of acceptance.   [name], LPC, [facility], [facility] Triage Specialist [phone]

## 2017-01-30 ENCOUNTER — Other Ambulatory Visit: Payer: Self-pay | Admitting: Family Medicine

## 2017-01-30 DIAGNOSIS — Z1231 Encounter for screening mammogram for malignant neoplasm of breast: Secondary | ICD-10-CM

## 2017-02-22 ENCOUNTER — Ambulatory Visit
Admission: RE | Admit: 2017-02-22 | Discharge: 2017-02-22 | Disposition: A | Discharge status: Home / Self Care | Payer: Medicare Other | Source: Ambulatory Visit | Attending: Family Medicine | Admitting: Family Medicine

## 2017-02-22 DIAGNOSIS — Z1231 Encounter for screening mammogram for malignant neoplasm of breast: Secondary | ICD-10-CM

## 2018-02-20 ENCOUNTER — Other Ambulatory Visit: Payer: Self-pay | Admitting: Family Medicine

## 2018-02-20 DIAGNOSIS — Z139 Encounter for screening, unspecified: Secondary | ICD-10-CM

## 2018-03-14 ENCOUNTER — Ambulatory Visit
Admission: RE | Admit: 2018-03-14 | Discharge: 2018-03-14 | Disposition: A | Discharge status: Home / Self Care | Payer: Medicare Other | Source: Ambulatory Visit | Attending: Family Medicine | Admitting: Family Medicine

## 2018-03-14 DIAGNOSIS — Z139 Encounter for screening, unspecified: Secondary | ICD-10-CM

## 2019-03-27 ENCOUNTER — Ambulatory Visit: Payer: Medicare Other | Admitting: Podiatry

## 2019-04-03 ENCOUNTER — Encounter: Payer: Self-pay | Admitting: Podiatry

## 2019-04-03 ENCOUNTER — Ambulatory Visit (INDEPENDENT_AMBULATORY_CARE_PROVIDER_SITE_OTHER): Payer: Medicare Other | Admitting: Podiatry

## 2019-04-03 ENCOUNTER — Other Ambulatory Visit: Payer: Self-pay

## 2019-04-03 VITALS — BP 112/82 | HR 95 | Temp 98.5°F | Resp 16

## 2019-04-03 DIAGNOSIS — L600 Ingrowing nail: Secondary | ICD-10-CM

## 2019-04-03 MED ORDER — NEOMYCIN-POLYMYXIN-HC 3.5-10000-1 OT SOLN: OTIC | 1 refills | Status: DC

## 2019-04-03 NOTE — Progress Notes (Signed)
   Subjective:    Patient ID: [name], female    DOB: 07-08-1966, 53 y.o.   MRN: [id number]  HPI    Review of Systems  All other systems reviewed and are negative.      Objective:   Physical Exam        Assessment & Plan:

## 2019-04-03 NOTE — Patient Instructions (Signed)

## 2019-04-03 NOTE — Progress Notes (Signed)
Subjective:   Patient ID: [name], female   DOB: 53 y.o.   MRN: [id number]   HPI Patient presents with severe thickening of the right big toenail that is been bothersome and that they cannot cut.  She lives in a group home and they are not able to handle this and is become increasingly tender with shoe gear.  Patient does smoke a small amount and is not active currently   Review of Systems  All other systems reviewed and are negative.       Objective:  Physical Exam Vitals signs and nursing note reviewed.  Constitutional:      Appearance: She is well-developed.  Pulmonary:     Effort: Pulmonary effort is normal.  Musculoskeletal: Normal range of motion.  Skin:    General: Skin is warm.  Neurological:     Mental Status: She is alert.     Neurovascular status found to be intact muscle strength is adequate range of motion within normal limits with patient found to have a severely thickened dystrophic hallux nail right that is moderately painful when pressed and make shoe gear difficult.  Patient is found to have good digital perfusion well oriented x3     Assessment:  Chronic damaged hallux nail right with pain     Plan:  H&P condition reviewed and recommended correction.  Explained procedure risk and patient wants surgery understanding problem and at this point after review consent form signed and patient had the right hallux infiltrated 60 mg like Marcaine mixture sterile prep applied to the right big toe and using sterile instrumentation hallux nail remove base exposed and Cabbell consider phenol 3 applications followed by alcohol lavage sterile dressing.  Gave instructions to leave this on 24 hours but take it off earlier wrote prescription for drops and patient to be seen back and encouraged to call with questions

## 2019-04-07 ENCOUNTER — Telehealth: Payer: Self-pay | Admitting: Podiatry

## 2019-04-18 ENCOUNTER — Other Ambulatory Visit: Payer: Self-pay | Admitting: Family Medicine

## 2019-04-18 DIAGNOSIS — Z1231 Encounter for screening mammogram for malignant neoplasm of breast: Secondary | ICD-10-CM

## 2019-05-27 ENCOUNTER — Other Ambulatory Visit: Payer: Self-pay | Admitting: Podiatry

## 2019-06-05 ENCOUNTER — Other Ambulatory Visit: Payer: Self-pay

## 2019-06-05 ENCOUNTER — Ambulatory Visit
Admission: RE | Admit: 2019-06-05 | Discharge: 2019-06-05 | Disposition: A | Discharge status: Home / Self Care | Payer: Medicare Other | Source: Ambulatory Visit | Attending: Family Medicine | Admitting: Family Medicine

## 2019-06-05 DIAGNOSIS — Z1231 Encounter for screening mammogram for malignant neoplasm of breast: Secondary | ICD-10-CM

## 2020-05-10 ENCOUNTER — Other Ambulatory Visit: Payer: Self-pay | Admitting: Family Medicine

## 2020-05-10 DIAGNOSIS — Z1231 Encounter for screening mammogram for malignant neoplasm of breast: Secondary | ICD-10-CM

## 2020-06-08 ENCOUNTER — Ambulatory Visit: Payer: Medicare Other

## 2020-07-12 ENCOUNTER — Other Ambulatory Visit: Payer: Self-pay

## 2020-07-12 ENCOUNTER — Ambulatory Visit
Admission: RE | Admit: 2020-07-12 | Discharge: 2020-07-12 | Disposition: A | Discharge status: Home / Self Care | Payer: Medicare Other | Source: Ambulatory Visit | Attending: Family Medicine | Admitting: Family Medicine

## 2020-07-12 DIAGNOSIS — Z1231 Encounter for screening mammogram for malignant neoplasm of breast: Secondary | ICD-10-CM

## 2021-02-15 DIAGNOSIS — R609 Edema, unspecified: Secondary | ICD-10-CM | POA: Diagnosis not present

## 2021-06-14 DIAGNOSIS — Z23 Encounter for immunization: Secondary | ICD-10-CM | POA: Diagnosis not present

## 2021-06-14 DIAGNOSIS — J301 Allergic rhinitis due to pollen: Secondary | ICD-10-CM | POA: Diagnosis not present

## 2021-06-14 DIAGNOSIS — J45909 Unspecified asthma, uncomplicated: Secondary | ICD-10-CM | POA: Diagnosis not present

## 2021-06-14 DIAGNOSIS — Z0000 Encounter for general adult medical examination without abnormal findings: Secondary | ICD-10-CM | POA: Diagnosis not present

## 2021-06-14 DIAGNOSIS — E782 Mixed hyperlipidemia: Secondary | ICD-10-CM | POA: Diagnosis not present

## 2021-06-23 NOTE — Telephone Encounter (Signed)
error 

## 2021-09-06 ENCOUNTER — Other Ambulatory Visit: Payer: Self-pay | Admitting: Family Medicine

## 2021-09-06 DIAGNOSIS — Z1231 Encounter for screening mammogram for malignant neoplasm of breast: Secondary | ICD-10-CM

## 2021-10-07 ENCOUNTER — Ambulatory Visit
Admission: RE | Admit: 2021-10-07 | Discharge: 2021-10-07 | Disposition: A | Discharge status: Home / Self Care | Payer: Commercial Managed Care - HMO | Source: Ambulatory Visit | Attending: Family Medicine | Admitting: Family Medicine

## 2021-10-07 DIAGNOSIS — Z1231 Encounter for screening mammogram for malignant neoplasm of breast: Secondary | ICD-10-CM

## 2022-04-27 IMAGING — MG DIGITAL SCREENING BILAT W/ TOMO W/ CAD
8 series · 8 of 24 positions shown · non-contrast
Comparison: Previous exam(s).

CLINICAL DATA: Screening.

EXAM:
DIGITAL SCREENING BILATERAL MAMMOGRAM WITH TOMO AND CAD

[L CC synth-2D]
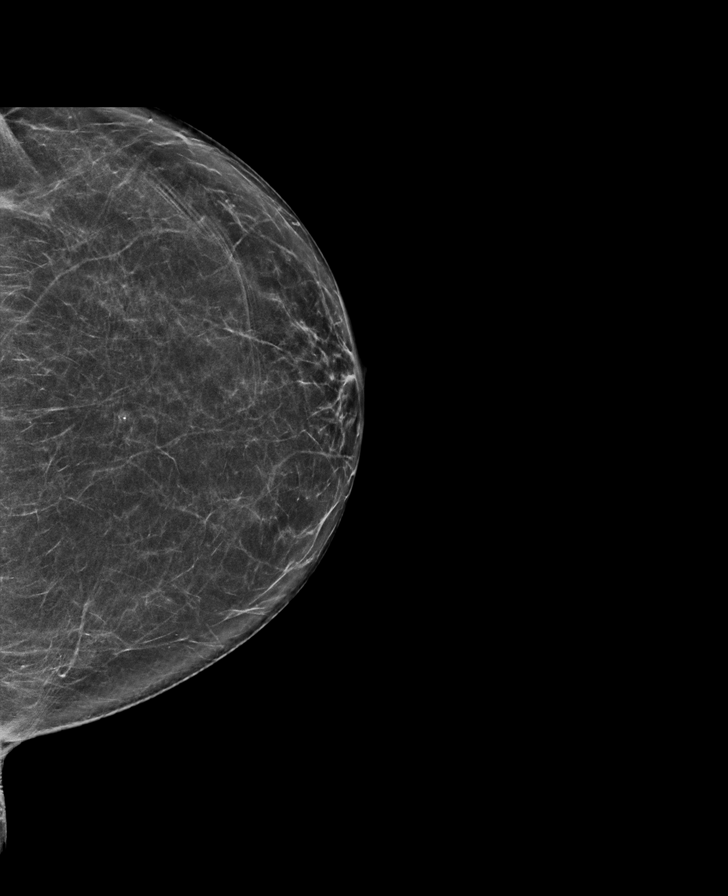

[R MLO synth-2D]
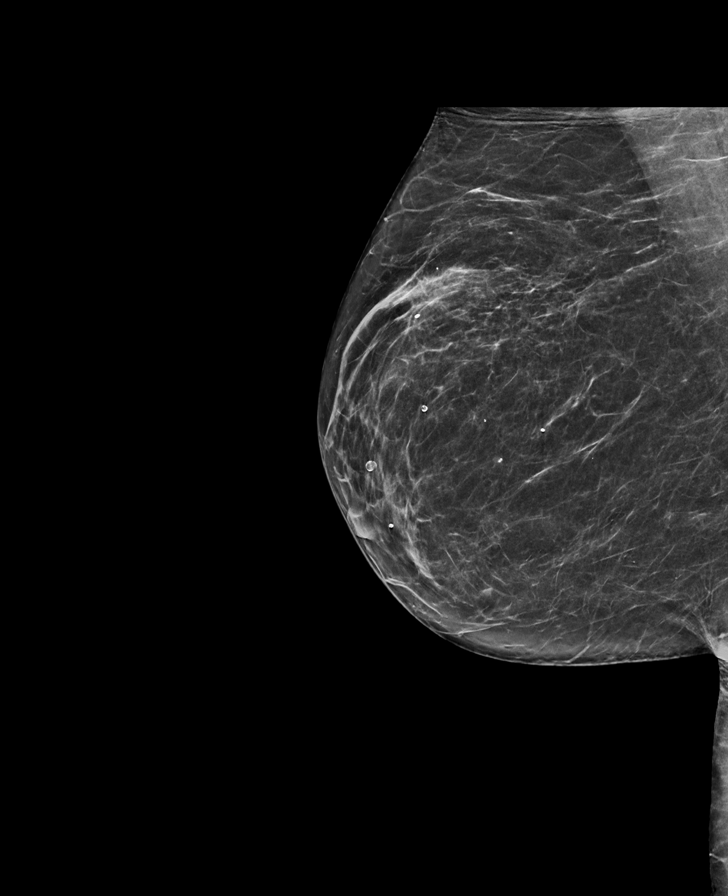

[L MLO synth-2D]
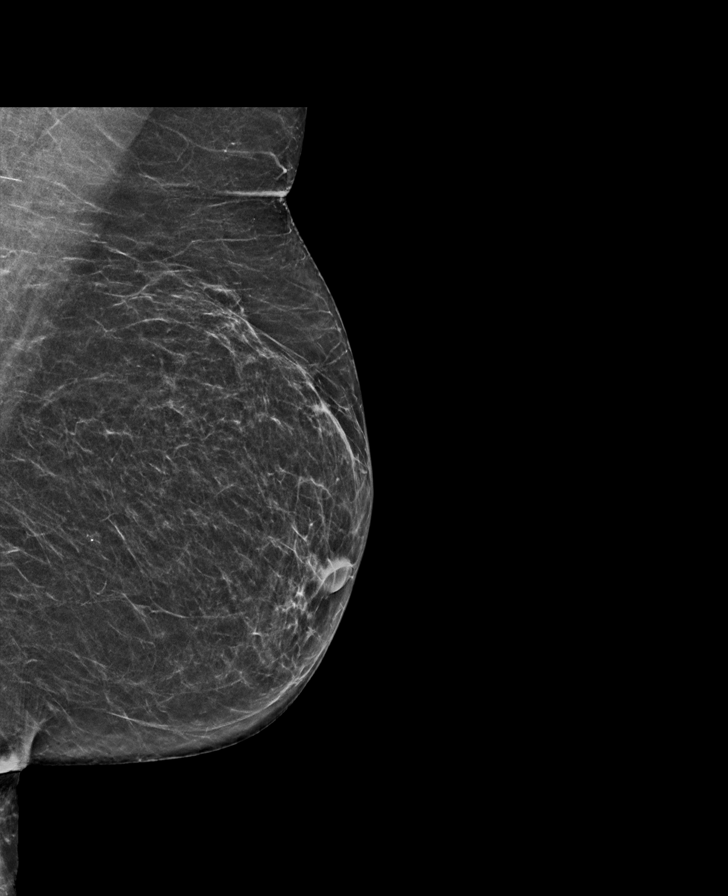

[R CC synth-2D]
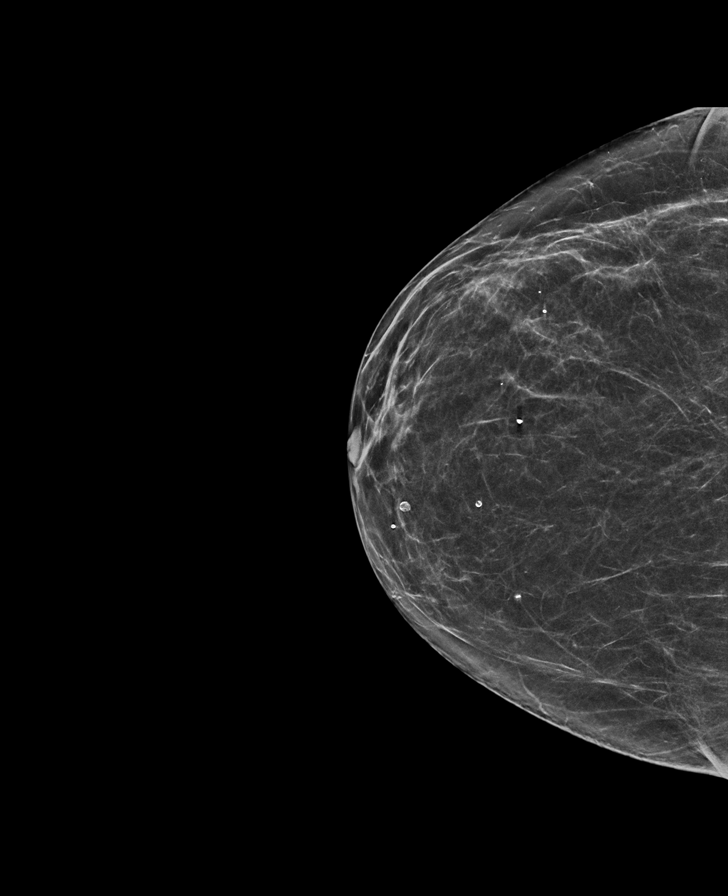

[R CC tomo · tomo slice 32/63.0]
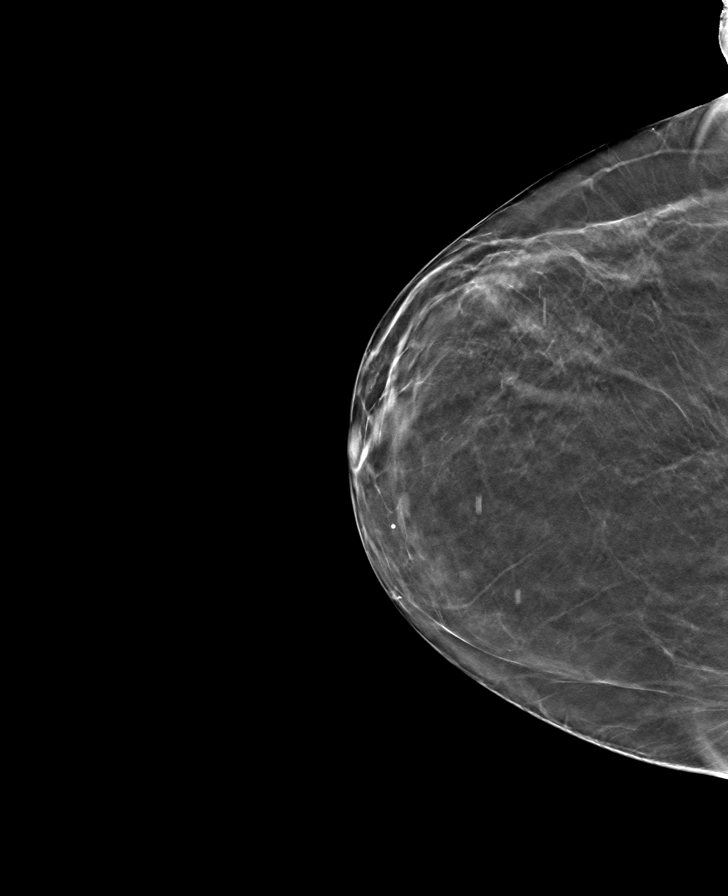

[L MLO tomo · tomo slice 31/61.0]
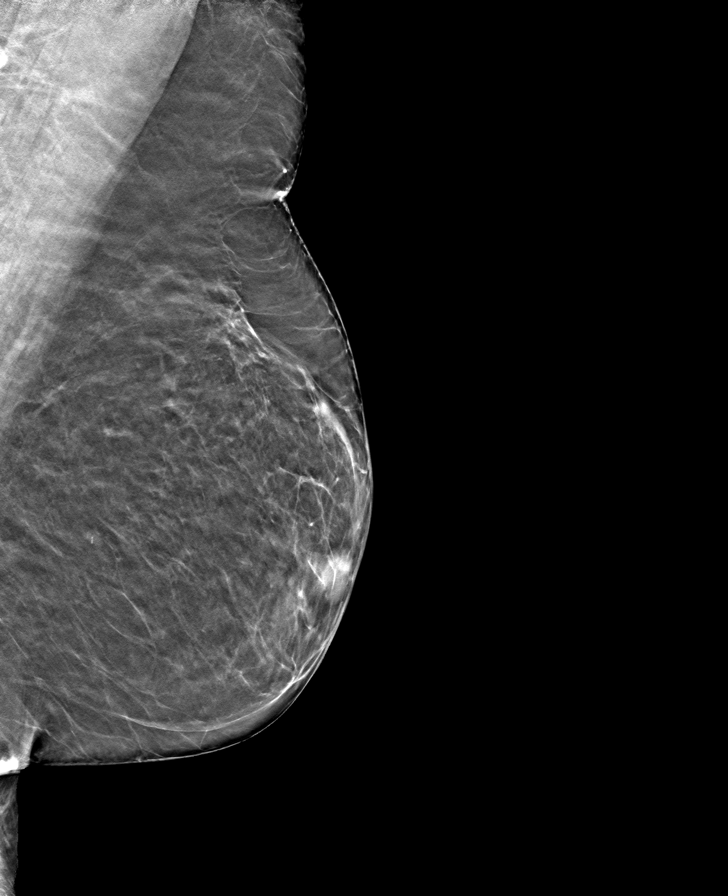

[L CC tomo · tomo slice 33/64.0]
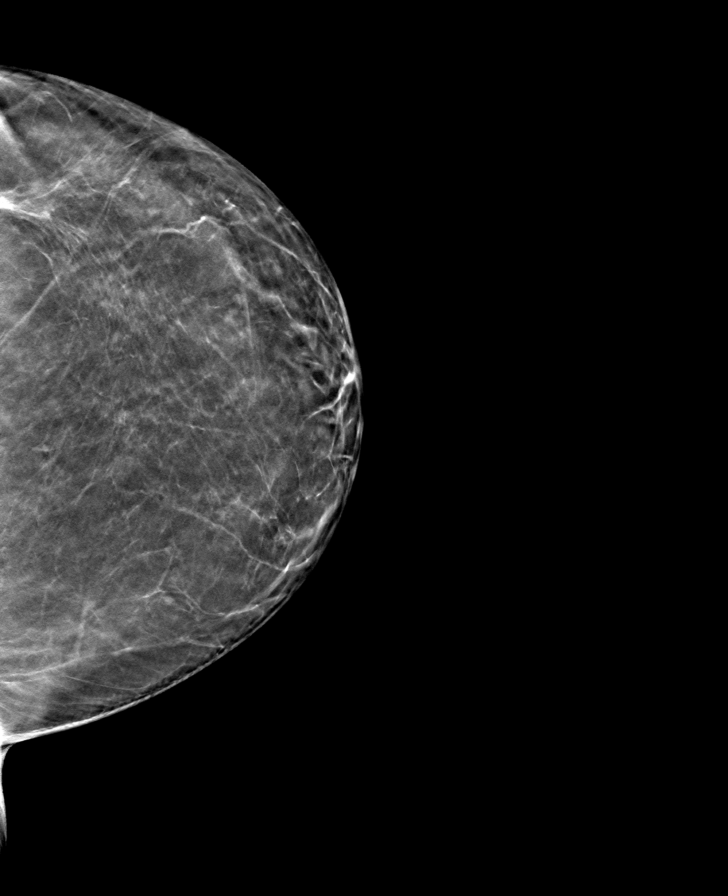

[R MLO tomo · tomo slice 35/69.0]
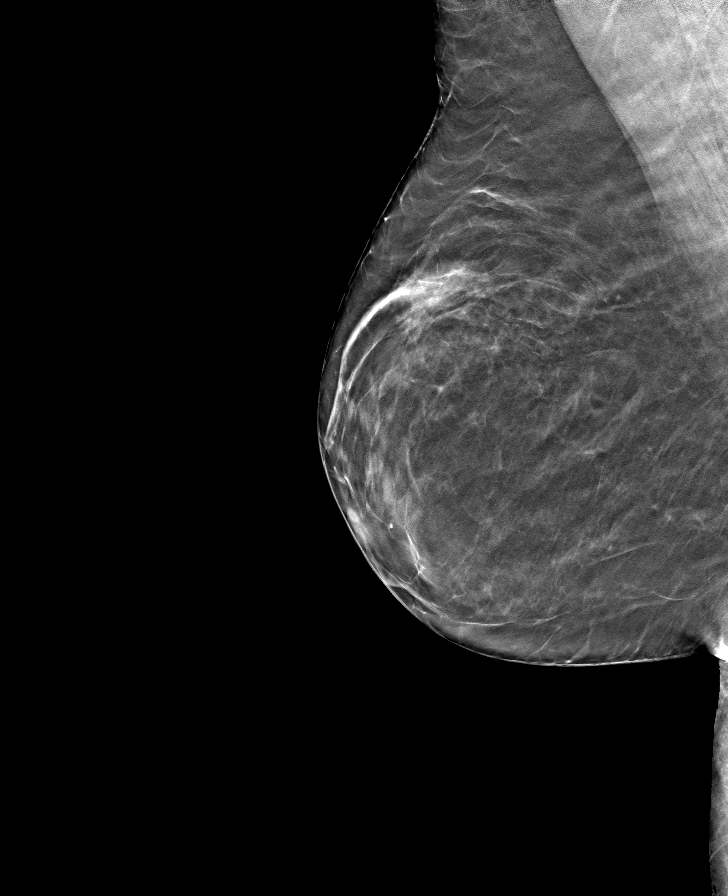

[8 of 24 positions shown; findings below may reference images not displayed]

ACR Breast Density Category b: There are scattered areas of
fibroglandular density.
FINDINGS: There are no findings suspicious for malignancy. Images were
processed with CAD.
IMPRESSION: No mammographic evidence of malignancy. A result letter of this
screening mammogram will be mailed directly to the patient.

RECOMMENDATION:
Screening mammogram in one year. (Code:[id number])

BI-RADS CATEGORY  1: Negative.

## 2022-06-21 DIAGNOSIS — E782 Mixed hyperlipidemia: Secondary | ICD-10-CM | POA: Diagnosis not present

## 2022-06-21 DIAGNOSIS — J301 Allergic rhinitis due to pollen: Secondary | ICD-10-CM | POA: Diagnosis not present

## 2022-06-21 DIAGNOSIS — J45909 Unspecified asthma, uncomplicated: Secondary | ICD-10-CM | POA: Diagnosis not present

## 2022-06-21 DIAGNOSIS — Z720 Tobacco use: Secondary | ICD-10-CM | POA: Diagnosis not present

## 2022-06-21 DIAGNOSIS — Z0000 Encounter for general adult medical examination without abnormal findings: Secondary | ICD-10-CM | POA: Diagnosis not present

## 2022-06-21 DIAGNOSIS — Z23 Encounter for immunization: Secondary | ICD-10-CM | POA: Diagnosis not present

## 2022-09-08 ENCOUNTER — Other Ambulatory Visit: Payer: Self-pay | Admitting: Family Medicine

## 2022-09-08 DIAGNOSIS — Z1231 Encounter for screening mammogram for malignant neoplasm of breast: Secondary | ICD-10-CM

## 2022-11-08 ENCOUNTER — Ambulatory Visit
Admission: RE | Admit: 2022-11-08 | Discharge: 2022-11-08 | Disposition: A | Discharge status: Home / Self Care | Payer: 59 | Source: Ambulatory Visit | Attending: Family Medicine | Admitting: Family Medicine

## 2022-11-08 DIAGNOSIS — Z1231 Encounter for screening mammogram for malignant neoplasm of breast: Secondary | ICD-10-CM

## 2022-11-24 DIAGNOSIS — F17200 Nicotine dependence, unspecified, uncomplicated: Secondary | ICD-10-CM | POA: Diagnosis not present

## 2022-11-24 DIAGNOSIS — R8290 Unspecified abnormal findings in urine: Secondary | ICD-10-CM | POA: Diagnosis not present

## 2022-11-24 DIAGNOSIS — R350 Frequency of micturition: Secondary | ICD-10-CM | POA: Diagnosis not present

## 2022-12-27 DIAGNOSIS — E889 Metabolic disorder, unspecified: Secondary | ICD-10-CM | POA: Diagnosis not present

## 2023-01-05 ENCOUNTER — Emergency Department (HOSPITAL_COMMUNITY): Payer: 59

## 2023-01-05 ENCOUNTER — Encounter (HOSPITAL_COMMUNITY): Payer: Self-pay

## 2023-01-05 ENCOUNTER — Inpatient Hospital Stay (HOSPITAL_COMMUNITY)
Admission: EM | Admit: 2023-01-05 | Discharge: 2023-01-09 | DRG: 092 | Disposition: A | Discharge status: Home Health | Payer: 59 | Attending: Internal Medicine | Admitting: Internal Medicine

## 2023-01-05 DIAGNOSIS — F3189 Other bipolar disorder: Secondary | ICD-10-CM | POA: Diagnosis not present

## 2023-01-05 DIAGNOSIS — N179 Acute kidney failure, unspecified: Secondary | ICD-10-CM | POA: Diagnosis not present

## 2023-01-05 DIAGNOSIS — D696 Thrombocytopenia, unspecified: Secondary | ICD-10-CM | POA: Diagnosis present

## 2023-01-05 DIAGNOSIS — Z79899 Other long term (current) drug therapy: Secondary | ICD-10-CM | POA: Diagnosis not present

## 2023-01-05 DIAGNOSIS — T426X1A Poisoning by other antiepileptic and sedative-hypnotic drugs, accidental (unintentional), initial encounter: Secondary | ICD-10-CM | POA: Diagnosis not present

## 2023-01-05 DIAGNOSIS — G928 Other toxic encephalopathy: Principal | ICD-10-CM | POA: Diagnosis present

## 2023-01-05 DIAGNOSIS — R4781 Slurred speech: Secondary | ICD-10-CM | POA: Diagnosis not present

## 2023-01-05 DIAGNOSIS — E860 Dehydration: Secondary | ICD-10-CM | POA: Diagnosis not present

## 2023-01-05 DIAGNOSIS — R9431 Abnormal electrocardiogram [ECG] [EKG]: Secondary | ICD-10-CM | POA: Diagnosis not present

## 2023-01-05 DIAGNOSIS — F209 Schizophrenia, unspecified: Secondary | ICD-10-CM | POA: Diagnosis present

## 2023-01-05 DIAGNOSIS — R9389 Abnormal findings on diagnostic imaging of other specified body structures: Secondary | ICD-10-CM | POA: Diagnosis not present

## 2023-01-05 DIAGNOSIS — F17290 Nicotine dependence, other tobacco product, uncomplicated: Secondary | ICD-10-CM | POA: Diagnosis present

## 2023-01-05 DIAGNOSIS — R7989 Other specified abnormal findings of blood chemistry: Secondary | ICD-10-CM | POA: Diagnosis not present

## 2023-01-05 DIAGNOSIS — E7800 Pure hypercholesterolemia, unspecified: Secondary | ICD-10-CM | POA: Diagnosis present

## 2023-01-05 DIAGNOSIS — R269 Unspecified abnormalities of gait and mobility: Secondary | ICD-10-CM

## 2023-01-05 DIAGNOSIS — R296 Repeated falls: Secondary | ICD-10-CM | POA: Diagnosis present

## 2023-01-05 DIAGNOSIS — R4182 Altered mental status, unspecified: Secondary | ICD-10-CM | POA: Diagnosis not present

## 2023-01-05 DIAGNOSIS — R262 Difficulty in walking, not elsewhere classified: Secondary | ICD-10-CM | POA: Diagnosis not present

## 2023-01-05 DIAGNOSIS — G9341 Metabolic encephalopathy: Secondary | ICD-10-CM | POA: Diagnosis not present

## 2023-01-05 DIAGNOSIS — F200 Paranoid schizophrenia: Secondary | ICD-10-CM | POA: Diagnosis present

## 2023-01-05 DIAGNOSIS — Z888 Allergy status to other drugs, medicaments and biological substances status: Secondary | ICD-10-CM | POA: Diagnosis not present

## 2023-01-05 DIAGNOSIS — T426X5A Adverse effect of other antiepileptic and sedative-hypnotic drugs, initial encounter: Secondary | ICD-10-CM | POA: Diagnosis not present

## 2023-01-05 DIAGNOSIS — E876 Hypokalemia: Secondary | ICD-10-CM | POA: Diagnosis not present

## 2023-01-05 DIAGNOSIS — I959 Hypotension, unspecified: Secondary | ICD-10-CM | POA: Diagnosis not present

## 2023-01-05 LAB — VALPROIC ACID LEVEL
Valproic Acid Lvl: 116 ug/mL — ABNORMAL HIGH (ref 50.0–100.0)
Valproic Acid Lvl: 116 ug/mL — ABNORMAL HIGH (ref 50.0–100.0)
Valproic Acid Lvl: 94 ug/mL (ref 50.0–100.0)

## 2023-01-05 LAB — CBC WITH DIFFERENTIAL/PLATELET
Abs Immature Granulocytes: 0.04 10*3/uL (ref 0.00–0.07)
Basophils Absolute: 0 10*3/uL (ref 0.0–0.1)
Basophils Relative: 1 %
Eosinophils Absolute: 0 10*3/uL (ref 0.0–0.5)
Eosinophils Relative: 1 %
HCT: 44 % (ref 36.0–46.0)
Hemoglobin: 14 g/dL (ref 12.0–15.0)
Immature Granulocytes: 1 %
Lymphocytes Relative: 24 %
Lymphs Abs: 1.4 10*3/uL (ref 0.7–4.0)
MCH: 31.7 pg (ref 26.0–34.0)
MCHC: 31.8 g/dL (ref 30.0–36.0)
MCV: 99.8 fL (ref 80.0–100.0)
Monocytes Absolute: 0.7 10*3/uL (ref 0.1–1.0)
Monocytes Relative: 12 %
Neutro Abs: 3.6 10*3/uL (ref 1.7–7.7)
Neutrophils Relative %: 61 %
Platelets: 91 10*3/uL — ABNORMAL LOW (ref 150–400)
RBC: 4.41 MIL/uL (ref 3.87–5.11)
RDW: 13.1 % (ref 11.5–15.5)
WBC: 5.7 10*3/uL (ref 4.0–10.5)
nRBC: 0 % (ref 0.0–0.2)

## 2023-01-05 LAB — RAPID URINE DRUG SCREEN, HOSP PERFORMED
Amphetamines: NOT DETECTED
Barbiturates: NOT DETECTED
Benzodiazepines: POSITIVE — AB
Cocaine: NOT DETECTED
Opiates: NOT DETECTED
Tetrahydrocannabinol: NOT DETECTED

## 2023-01-05 LAB — ETHANOL: Alcohol, Ethyl (B): <10 mg/dL (ref <10)

## 2023-01-05 LAB — COMPREHENSIVE METABOLIC PANEL
ALT: 15 U/L (ref 0–44)
AST: 24 U/L (ref 15–41)
Albumin: 3.2 g/dL — ABNORMAL LOW (ref 3.5–5.0)
Alkaline Phosphatase: 50 U/L (ref 38–126)
Anion gap: 9 (ref 5–15)
BUN: 16 mg/dL (ref 6–20)
CO2: 23 mmol/L (ref 22–32)
Calcium: 8.8 mg/dL — ABNORMAL LOW (ref 8.9–10.3)
Chloride: 108 mmol/L (ref 98–111)
Creatinine, Ser: 1.14 mg/dL — ABNORMAL HIGH (ref 0.44–1.00)
GFR, Estimated: 56 mL/min — ABNORMAL LOW (ref >60)
Glucose, Bld: 79 mg/dL (ref 70–99)
Potassium: 4.1 mmol/L (ref 3.5–5.1)
Sodium: 140 mmol/L (ref 135–145)
Total Bilirubin: 0.7 mg/dL (ref 0.3–1.2)
Total Protein: 5.9 g/dL — ABNORMAL LOW (ref 6.5–8.1)

## 2023-01-05 LAB — AMMONIA: Ammonia: 22 umol/L (ref 9–35)

## 2023-01-05 LAB — PROCALCITONIN: Procalcitonin: <0.1 ng/mL

## 2023-01-05 LAB — URINALYSIS, ROUTINE W REFLEX MICROSCOPIC
Bilirubin Urine: NEGATIVE
Glucose, UA: NEGATIVE mg/dL
Hgb urine dipstick: NEGATIVE
Ketones, ur: NEGATIVE mg/dL
Leukocytes,Ua: NEGATIVE
Nitrite: NEGATIVE
Protein, ur: NEGATIVE mg/dL
Specific Gravity, Urine: 1.011 (ref 1.005–1.030)
pH: 6 (ref 5.0–8.0)

## 2023-01-05 LAB — BRAIN NATRIURETIC PEPTIDE: B Natriuretic Peptide: 41.7 pg/mL (ref 0.0–100.0)

## 2023-01-05 LAB — TROPONIN I (HIGH SENSITIVITY)
Troponin I (High Sensitivity): 17 ng/L (ref <18)
Troponin I (High Sensitivity): 21 ng/L — ABNORMAL HIGH (ref <18)

## 2023-01-05 LAB — CBG MONITORING, ED: Glucose-Capillary: 82 mg/dL (ref 70–99)

## 2023-01-05 MED ORDER — ONDANSETRON HCL 4 MG/2ML IJ SOLN: 4.0000 mg | Freq: Four times a day (QID) | INTRAMUSCULAR | Status: DC | PRN

## 2023-01-05 MED ORDER — FLUPHENAZINE HCL 5 MG PO TABS
10.0000 mg | ORAL_TABLET | Freq: Three times a day (TID) | ORAL | Status: DC
  Administered 2023-01-05 – 2023-01-09 (×11): 10 mg via ORAL
  Filled 2023-01-05 (×12): qty 2

## 2023-01-05 MED ORDER — ALBUTEROL SULFATE (2.5 MG/3ML) 0.083% IN NEBU: 2.5000 mg | INHALATION_SOLUTION | Freq: Four times a day (QID) | RESPIRATORY_TRACT | Status: DC | PRN

## 2023-01-05 MED ORDER — SODIUM CHLORIDE 0.9 % IV SOLN: INTRAVENOUS | Status: DC

## 2023-01-05 MED ORDER — LORAZEPAM 1 MG PO TABS
1.0000 mg | ORAL_TABLET | Freq: Two times a day (BID) | ORAL | Status: DC
  Administered 2023-01-05 – 2023-01-08 (×7): 1 mg via ORAL
  Filled 2023-01-05 (×7): qty 1

## 2023-01-05 MED ORDER — SODIUM CHLORIDE 0.9 % IV SOLN
500.0000 mg | Freq: Once | INTRAVENOUS | Status: AC
  Administered 2023-01-05: 500 mg via INTRAVENOUS
  Filled 2023-01-05: qty 5

## 2023-01-05 MED ORDER — BENZTROPINE MESYLATE 1 MG PO TABS
1.0000 mg | ORAL_TABLET | Freq: Two times a day (BID) | ORAL | Status: DC
  Administered 2023-01-05 – 2023-01-09 (×8): 1 mg via ORAL
  Filled 2023-01-05 (×9): qty 1

## 2023-01-05 MED ORDER — ACETAMINOPHEN 325 MG PO TABS: 650.0000 mg | ORAL_TABLET | Freq: Four times a day (QID) | ORAL | Status: DC | PRN

## 2023-01-05 MED ORDER — SODIUM CHLORIDE 0.9 % IV SOLN
1.0000 g | Freq: Once | INTRAVENOUS | Status: AC
  Administered 2023-01-05: 1 g via INTRAVENOUS
  Filled 2023-01-05: qty 10

## 2023-01-05 MED ORDER — ONDANSETRON HCL 4 MG PO TABS: 4.0000 mg | ORAL_TABLET | Freq: Four times a day (QID) | ORAL | Status: DC | PRN

## 2023-01-05 MED ORDER — SODIUM CHLORIDE 0.9% FLUSH
3.0000 mL | Freq: Two times a day (BID) | INTRAVENOUS | Status: DC
  Administered 2023-01-05 – 2023-01-09 (×7): 3 mL via INTRAVENOUS

## 2023-01-05 MED ORDER — ENOXAPARIN SODIUM 40 MG/0.4ML IJ SOSY
40.0000 mg | PREFILLED_SYRINGE | INTRAMUSCULAR | Status: DC
  Administered 2023-01-05: 40 mg via SUBCUTANEOUS
  Filled 2023-01-05 (×3): qty 0.4

## 2023-01-05 MED ORDER — ACETAMINOPHEN 650 MG RE SUPP: 650.0000 mg | Freq: Four times a day (QID) | RECTAL | Status: DC | PRN

## 2023-01-05 NOTE — ED Notes (Signed)
ED TO INPATIENT HANDOFF REPORT  ED Nurse Name and Phone #: Osvaldo Shipper RN [phone]  S Name/Age/Gender [name] 57 y.o. female Room/Bed: 4065336517  Code Status   Code Status: Full Code  Home/SNF/Other Group home Patient oriented to: self Is this baseline? No   Triage Complete: Triage complete  Chief Complaint Altered mental status, unspecified altered mental status type [R41.82] Acute metabolic encephalopathy [G93.41]  Triage Note Pt BIB [facility] from assisted living/group home [address]. Facility reported pt has had and unsteady gait and frequent falls since Sunday and slower to respond. No injuries reported from falls. Pt had change in depakote med in Feb. Pt is normally independent at baseline. EMS reported a shuffling gait, their stroke screen negative.   BP 100/60 HR 68 95% room air CBG 112   Allergies Allergies  Allergen Reactions   Abilify [Aripiprazole] Other (See Comments)    Unknown.    Ativan [Lorazepam] Other (See Comments)    Unknown.   Atorvastatin Other (See Comments)    Unknown.    Clozaril [Clozapine] Other (See Comments)    Unknown.    Geodon [Ziprasidone Hcl] Other (See Comments)    Unknown.    Seroquel [Quetiapine Fumarate] Other (See Comments)    Unknown reaction.     Level of Care/Admitting Diagnosis ED Disposition     ED Disposition  Admit   Condition  --   Comment  Hospital Area: [facility] [[id number]]  Level of Care: Telemetry Medical [[id number]]  May place patient in observation at [facility] or [facility] if equivalent level of care is available:: No  Covid Evaluation: Asymptomatic - no recent exposure (last 10 days) testing not required  Diagnosis: Acute metabolic encephalopathy [1324401]  Admitting Physician: Clydie Braun [[id number]]  Attending Physician: Clydie Braun [[id number]]          B Medical/Surgery History Past Medical History:  Diagnosis Date   Hypercholesteremia     Schizophrenia    Tobacco dependence    Past Surgical History:  Procedure Laterality Date   BREAST CYST ASPIRATION     right/ unsure when   CESAREAN SECTION       A IV Location/Drains/Wounds Patient Lines/Drains/Airways Status     Active Line/Drains/Airways     Name Placement date Placement time Site Days   Peripheral IV 01/05/23 20 G Posterior;Right Hand 01/05/23  1620  Hand  less than 1            Intake/Output Last 24 hours No intake or output data in the 24 hours ending 01/05/23 1706  Labs/Imaging Results for orders placed or performed during the hospital encounter of 01/05/23 (from the past 48 hour(s))  Comprehensive metabolic panel     Status: Abnormal   Collection Time: 01/05/23 11:04 AM  Result Value Ref Range   Sodium 140 135 - 145 mmol/L   Potassium 4.1 3.5 - 5.1 mmol/L   Chloride 108 98 - 111 mmol/L   CO2 23 22 - 32 mmol/L   Glucose, Bld 79 70 - 99 mg/dL    Comment: Glucose reference range applies only to samples taken after fasting for at least 8 hours.   BUN 16 6 - 20 mg/dL   Creatinine, Ser 4.25 (H) 0.44 - 1.00 mg/dL   Calcium 8.8 (L) 8.9 - 10.3 mg/dL   Total Protein 5.9 (L) 6.5 - 8.1 g/dL   Albumin 3.2 (L) 3.5 - 5.0 g/dL   AST 24 15 - 41 U/L  ALT 15 0 - 44 U/L   Alkaline Phosphatase 50 38 - 126 U/L   Total Bilirubin 0.7 0.3 - 1.2 mg/dL   GFR, Estimated 56 (L) >60 mL/min    Comment: (NOTE) Calculated using the CKD-EPI Creatinine Equation (2021)    Anion gap 9 5 - 15    Comment: Performed at [facility], [address], [place], [place] [address]  CBC with Differential     Status: Abnormal   Collection Time: 01/05/23 11:04 AM  Result Value Ref Range   WBC 5.7 4.0 - 10.5 K/uL   RBC 4.41 3.87 - 5.11 MIL/uL   Hemoglobin 14.0 12.0 - 15.0 g/dL   HCT 21.3 08.6 - 57.8 %   MCV 99.8 80.0 - 100.0 fL   MCH 31.7 26.0 - 34.0 pg   MCHC 31.8 30.0 - 36.0 g/dL   RDW 46.9 62.9 - 52.8 %   Platelets 91 (L) 150 - 400 K/uL    Comment: Immature Platelet  Fraction may be clinically indicated, consider ordering this additional test [id number] REPEATED TO VERIFY    nRBC 0.0 0.0 - 0.2 %   Neutrophils Relative % 61 %   Neutro Abs 3.6 1.7 - 7.7 K/uL   Lymphocytes Relative 24 %   Lymphs Abs 1.4 0.7 - 4.0 K/uL   Monocytes Relative 12 %   Monocytes Absolute 0.7 0.1 - 1.0 K/uL   Eosinophils Relative 1 %   Eosinophils Absolute 0.0 0.0 - 0.5 K/uL   Basophils Relative 1 %   Basophils Absolute 0.0 0.0 - 0.1 K/uL   Immature Granulocytes 1 %   Abs Immature Granulocytes 0.04 0.00 - 0.07 K/uL    Comment: Performed at [facility], [address] [place], [place] [address]  Troponin I (High Sensitivity)     Status: None   Collection Time: 01/05/23 11:04 AM  Result Value Ref Range   Troponin I (High Sensitivity) 17 <18 ng/L    Comment: (NOTE) Elevated high sensitivity troponin I (hsTnI) values and significant  changes across serial measurements may suggest ACS but many other  chronic and acute conditions are known to elevate hsTnI results.  Refer to the "Links" section for chest pain algorithms and additional  guidance. Performed at [facility], [address], [place], [place] [address]   Valproic acid level     Status: Abnormal   Collection Time: 01/05/23 11:04 AM  Result Value Ref Range   Valproic Acid Lvl 116 (H) 50.0 - 100.0 ug/mL    Comment: Performed at [facility], [address] [place], [place] [address]  CBG monitoring, ED     Status: None   Collection Time: 01/05/23 11:51 AM  Result Value Ref Range   Glucose-Capillary 82 70 - 99 mg/dL    Comment: Glucose reference range applies only to samples taken after fasting for at least 8 hours.  Troponin I (High Sensitivity)     Status: Abnormal   Collection Time: 01/05/23  1:07 PM  Result Value Ref Range   Troponin I (High Sensitivity) 21 (H) <18 ng/L    Comment: (NOTE) Elevated high sensitivity troponin I (hsTnI) values and significant  changes across serial  measurements may suggest ACS but many other  chronic and acute conditions are known to elevate hsTnI results.  Refer to the "Links" section for chest pain algorithms and additional  guidance. Performed at [facility], [address] [place], [place] [address]   Ethanol  Status: None   Collection Time: 01/05/23  1:07 PM  Result Value Ref Range   Alcohol, Ethyl (B) <10 <10 mg/dL    Comment: (NOTE) Lowest detectable limit for serum alcohol is 10 mg/dL.  For medical purposes only. Performed at [facility], [address] [place], [place] [address]   Ammonia     Status: None   Collection Time: 01/05/23  1:07 PM  Result Value Ref Range   Ammonia 22 9 - 35 umol/L    Comment: Performed at [facility], [address], [place], [place] [address]  Urinalysis, Routine w reflex microscopic -Urine, Clean Catch     Status: Abnormal   Collection Time: 01/05/23  2:20 PM  Result Value Ref Range   Color, Urine YELLOW YELLOW   APPearance HAZY (A) CLEAR   Specific Gravity, Urine 1.011 1.005 - 1.030   pH 6.0 5.0 - 8.0   Glucose, UA NEGATIVE NEGATIVE mg/dL   Hgb urine dipstick NEGATIVE NEGATIVE   Bilirubin Urine NEGATIVE NEGATIVE   Ketones, ur NEGATIVE NEGATIVE mg/dL   Protein, ur NEGATIVE NEGATIVE mg/dL   Nitrite NEGATIVE NEGATIVE   Leukocytes,Ua NEGATIVE NEGATIVE    Comment: Performed at [facility], [address] [place], [place] [address]  Urine rapid drug screen (hosp performed)     Status: Abnormal   Collection Time: 01/05/23  2:20 PM  Result Value Ref Range   Opiates NONE DETECTED NONE DETECTED   Cocaine NONE DETECTED NONE DETECTED   Benzodiazepines POSITIVE (A) NONE DETECTED   Amphetamines NONE DETECTED NONE DETECTED   Tetrahydrocannabinol NONE DETECTED NONE DETECTED   Barbiturates NONE DETECTED NONE DETECTED    Comment: (NOTE) DRUG SCREEN FOR MEDICAL PURPOSES ONLY.  IF CONFIRMATION IS NEEDED FOR ANY PURPOSE, NOTIFY LAB WITHIN 5 DAYS.  LOWEST  DETECTABLE LIMITS FOR URINE DRUG SCREEN Drug Class                     Cutoff (ng/mL) Amphetamine and metabolites    1000 Barbiturate and metabolites    200 Benzodiazepine                 200 Opiates and metabolites        300 Cocaine and metabolites        300 THC                            50 Performed at [facility], [address] [place], [place] [address]    CT Head Wo Contrast  Result Date: 01/05/2023 CLINICAL DATA:  Mental status change, unknown cause EXAM: CT HEAD WITHOUT CONTRAST TECHNIQUE: Contiguous axial images were obtained from the base of the skull through the vertex without intravenous contrast. RADIATION DOSE REDUCTION: This exam was performed according to the departmental dose-optimization program which includes automated exposure control, adjustment of the mA and/or kV according to patient size and/or use of iterative reconstruction technique. COMPARISON:  CT head November 05, 2003. FINDINGS: Brain: No evidence of acute infarction, hemorrhage, hydrocephalus, extra-axial collection or mass lesion/mass effect. Patchy white matter hypodensities, compatible with chronic microvascular ischemic disease. Cerebral atrophy. Partially empty sella. Vascular: No hyperdense vessel. Skull: No acute fracture. Sinuses/Orbits: Largely clear sinuses.  No acute orbital findings. Other: No mastoid effusions IMPRESSION: 1. No evidence of acute intracranial abnormality. 2. Partially empty sella, which is often a normal anatomic variant but can be associated with idiopathic intracranial  hypertension (pseudotumor cerebri). Electronically Signed   By: Feliberto Harts M.D.   On: 01/05/2023 12:00   DG Chest 2 View  Result Date: 01/05/2023 CLINICAL DATA:  Altered mental status. EXAM: CHEST - 2 VIEW COMPARISON:  None Available. FINDINGS: Normal cardiomediastinal contours. Low lung volumes. Mild increase interstitial markings noted bilaterally. No airspace consolidation or pleural effusion. The  visualized osseous structures are unremarkable. IMPRESSION: 1. Low lung volumes. 2. Mild increase interstitial markings may reflect early edema or atypical infection. Electronically Signed   By: Signa Kell M.D.   On: 01/05/2023 11:31    Pending Labs Unresulted Labs (From admission, onward)     Start     Ordered   01/06/23 0500  CBC  Tomorrow morning,   R        01/05/23 1610   01/06/23 0500  Basic metabolic panel  Tomorrow morning,   R        01/05/23 1610   01/05/23 1608  HIV Antibody (routine testing w rflx)  (HIV Antibody (Routine testing w reflex) panel)  Once,   R        01/05/23 1610   01/05/23 1605  Brain natriuretic peptide  Once,   R        01/05/23 1604   01/05/23 1558  Valproic acid level  Now then every 4 hours,   R      01/05/23 1558   01/05/23 1554  Procalcitonin  Once,   R       References:    Procalcitonin Lower Respiratory Tract Infection AND Sepsis Procalcitonin Algorithm   01/05/23 1553            Vitals/Pain Today's Vitals   01/05/23 1703 01/05/23 1704 01/05/23 1705 01/05/23 1706  BP:      Pulse: (!) 58 (!) 57 (!) 53 (!) 56  Resp: Temp:      TempSrc:      SpO2: 94% 96% 96% 95%  PainSc:        Isolation Precautions No active isolations  Medications Medications  azithromycin (ZITHROMAX) 500 mg in sodium chloride 0.9 % 250 mL IVPB (has no administration in time range)  0.9 %  sodium chloride infusion ( Intravenous New Bag/Given 01/05/23 1633)  enoxaparin (LOVENOX) injection 40 mg (has no administration in time range)  sodium chloride flush (NS) 0.9 % injection 3 mL (has no administration in time range)  acetaminophen (TYLENOL) tablet 650 mg (has no administration in time range)    Or  acetaminophen (TYLENOL) suppository 650 mg (has no administration in time range)  albuterol (PROVENTIL) (2.5 MG/3ML) 0.083% nebulizer solution 2.5 mg (has no administration in time range)  ondansetron (ZOFRAN) tablet 4 mg (has no administration in time  range)    Or  ondansetron (ZOFRAN) injection 4 mg (has no administration in time range)  cefTRIAXone (ROCEPHIN) 1 g in sodium chloride 0.9 % 100 mL IVPB (1 g Intravenous New Bag/Given 01/05/23 1631)    Mobility walks     Focused Assessments Neuro Assessment Handoff:  Swallow screen pass? Yes  Cardiac Rhythm: Normal sinus rhythm NIH Stroke Scale  Level of Consciousness (1a.)   : Alert, keenly responsive LOC Questions (1b. )   : Answers one question correctly (month wrong, stated march) LOC Commands (1c. )   : Performs both tasks correctly Best Gaze (2. )  : Normal Visual (3. )  : No visual loss Facial Palsy (4. )    : Normal symmetrical  movements Motor Arm, Left (5a. )   : No drift Motor Arm, Right (5b. ) : No drift Motor Leg, Left (6a. )  : No drift Motor Leg, Right (6b. ) : No drift Limb Ataxia (7. ): Present in one limb Sensory (8. )  : Normal, no sensory loss Best Language (9. )  : No aphasia Dysarthria (10. ): Normal Extinction/Inattention (11.)   : No Abnormality Complete NIHSS TOTAL: 2     Neuro Assessment: Exceptions to WDL Neuro Checks:      Has TPA been given? No If patient is a Neuro Trauma and patient is going to OR before floor call report to 4N Charge nurse: [phone] or [phone]   R Recommendations: See Admitting Provider Note  Report given to:   Additional Notes: Pt will pull out PIV if left alone and will attempt to escape.

## 2023-01-05 NOTE — H&P (Addendum)
History and Physical    Patient: [name] TGG:[id number] DOB: 1966-09-01 DOA: 01/05/2023 DOS: the patient was seen and examined on 01/05/2023 PCP: Lupita Raider, MD  Patient coming from: [facility]  Chief Complaint:  Chief Complaint  Patient presents with   Altered Mental Status   HPI: [name] is a 57 y.o. female with medical history significant of HLD, schizophrenia, and tobacco abuse who presents after being noted to be altered.  Patient currently resides in a facility and had been noted to not be herself for the last 4-5 days.  Reported to be shuffling gait with frequent falls, slow to respond, and having slurred speech.  Normally patient can ambulate without assistance, has clear speech, and can function independently. She chronically has delusions at baseline. Patient denies any shortness of breath, cough, chest pain, nausea, vomiting, diarrhea, or dysuria symptoms.  The patient's dose of Depakote had recently been increased about a month ago.  In the emergency department patient was noted to be afebrile with pulse 59-63, and all other vital signs maintained.  Labs noted platelets 91, BUN 16, creatinine 1.14, ammonia 22, and high-sensitivity troponin 17->21.  Chest x-ray noted mild increased interstitial markings concerning for early edema or atypical infection.  Urinalysis without signs of infection.  Patient was started on empiric antibiotics of Rocephin and azithromycin.   Review of Systems: As mentioned in the history of present illness. All other systems reviewed and are negative. Past Medical History:  Diagnosis Date   Hypercholesteremia    Schizophrenia    Tobacco dependence    Past Surgical History:  Procedure Laterality Date   BREAST CYST ASPIRATION     right/ unsure when   CESAREAN SECTION     Social History:  reports that she has been smoking cigars. She has been smoking an average of .02 packs per day. She has never used smokeless tobacco. She  reports current drug use. She reports that she does not drink alcohol.  Allergies  Allergen Reactions   Abilify [Aripiprazole] Other (See Comments)    Unknown.    Ativan [Lorazepam] Other (See Comments)    Unknown.   Atorvastatin Other (See Comments)    Unknown.    Clozaril [Clozapine] Other (See Comments)    Unknown.    Geodon [Ziprasidone Hcl] Other (See Comments)    Unknown.    Seroquel [Quetiapine Fumarate] Other (See Comments)    Unknown reaction.     Family History  Problem Relation Age of Onset   Breast cancer Neg Hx     Prior to Admission medications   Medication Sig Start Date End Date Taking? Authorizing Provider  acetaminophen (TYLENOL) 325 MG tablet Take 650 mg by mouth every 6 (six) hours as needed for mild pain, moderate pain, fever or headache.   Yes [provider]  benzonatate (TESSALON) 100 MG capsule Take 100 mg by mouth 3 (three) times daily as needed for cough.   Yes [provider]  benztropine (COGENTIN) 1 MG tablet Take 1 mg by mouth 2 (two) times daily. 03/11/19  Yes [provider]  Cholecalciferol (VITAMIN D) 50 MCG (2000 UT) CAPS Take 2,000 Units by mouth daily.   Yes [provider]  divalproex (DEPAKOTE ER) 500 MG 24 hr tablet Take 1,000 mg by mouth in the morning and at bedtime. 03/11/19  Yes [provider]  fluPHENAZine (PROLIXIN) 10 MG tablet Take 10 mg by mouth 3 (three) times daily. 03/11/19  Yes [provider]  LORazepam (ATIVAN) 1 MG tablet Take 1 mg by mouth 2 (two) times daily. 03/11/19  Yes [provider]  neomycin-polymyxin-hydrocortisone (CORTISPORIN) OTIC solution Apply 1-2 drops to toe after soaking BID Patient not taking: Reported on 01/05/2023 04/03/19   Lenn Sink, DPM    Physical Exam: Vitals:   01/05/23 1045 01/05/23 1105 01/05/23 1301  BP: (!) 108/55 107/70   Pulse: 63 (!) 56   Resp: 11 12   Temp:   97.7 F (36.5 C)  TempSrc:   Oral  SpO2: 96% 100%     Constitutional: Well-nourished female appears to be in acute distress Eyes: PERRL, lids and conjunctivae normal ENMT: Mucous membranes are moist.  Poor dentition. Neck: normal, supple  Respiratory: clear to auscultation bilaterally, no wheezing, no crackles. Normal respiratory effort. No accessory muscle use.  Cardiovascular: Regular rate and rhythm, no murmurs / rubs / gallops.  Abdomen: no tenderness, no masses palpated.  Bowel sounds positive.  Musculoskeletal: no clubbing / cyanosis. No joint deformity upper and lower extremities. Good ROM, no contractures. Normal muscle tone.  Skin: no rashes, lesions, ulcers. No induration Neurologic: CN 2-12 grossly intact.  Appears unsteady on her feet when getting up to move around. Psychiatric: Alert and orient to self and place.  Data Reviewed:  EKG reveals sinus rhythm at 60 bpm without significant ischemic changes.  Reviewed labs, imaging, and pertinent records as noted above in HPI.  Assessment and Plan: Acute metabolic encephalopathy and gait disturbance secondary to Depakote toxicity Over the last 4 to 5 days patient had noted to be altered with slurred speech and shuffling gait.  Normally able to ambulate without assistance and speech is reported to be clear.  CT scan of the head did not note any acute abnormality.  Workup revealed normal ammonia level, but Depakote level was noted to be elevated at 116.  Poison control has been notified recommended IV fluid hydration and serial Depakote monitoring. -Admit to a telemetry bed -Neurochecks -Aspiration precautions with elevation head of the bed -Hold Depakote and check Depakote level every 4 hours -IV fluids with at 75 mL/h -Physical therapy to evaluate  Elevated troponin Patient did not report any complaints of chest pain.  EKG was not ischemic.  High-sensitivity troponins 17->21. -Follow-up telemetry  Possible acute kidney injury Creatinine noted to be 1.14 with BUN 16.  Last  available labs from 2017 noted creatinine to be 0.74.  Urinalysis showed no acute sign of infection. -Normal saline IV fluids at 75 mL/h -Recheck kidney function tomorrow morning  Abnormal chest x-ray Acute.  Chest x-ray noted low lung volumes with mild increased interstitial markings concerning for early edema or atypical infection.  Patient has been started on empiric antibiotics of Rocephin and azithromycin. -Check procalcitonin and BNP -Reevaluate and determine need to continue Rocephin and azithromycin  Thrombocytopenia Acute.  Platelet count 91.  No reports of bleeding. -Recheck platelet count tomorrow morning  Schizophrenia  Patient has chronic delusions. -Hold Depakote and consider adjusting dose back to previous once levels back in a therapeutic range -Continue patient's other home medications  Tobacco Abuse  Patient smokes 2-5 cigars/day on average.  DVT prophylaxis: Advance Care Planning:   Code Status: Full Code   Consults: None  Family Communication: Patient's caregiver at the facility updated over the phone. Severity of Illness: The appropriate patient status for this patient is OBSERVATION. Observation status is judged to be reasonable and necessary in order to provide the required intensity of service to ensure the patient's  safety. The patient's presenting symptoms, physical exam findings, and initial radiographic and laboratory data in the context of their medical condition is felt to place them at decreased risk for further clinical deterioration. Furthermore, it is anticipated that the patient will be medically stable for discharge from the hospital within 2 midnights of admission.   Author: Clydie Braun, MD 01/05/2023 3:07 PM  For on call review [email or web address].

## 2023-01-05 NOTE — ED Provider Notes (Signed)
[facility] AT [facility] Provider Note   CSN: [id number] Arrival date & time: 01/05/23  1032     History  Chief Complaint  Patient presents with   Altered Mental Status   Level 5 caveat: Mental Status Change  [name] is a 57 y.o. female with a past medical history of paranoid schizophrenia, MDD, who presents emergency department brought in by EMS with concerns for difficulty walking onset 6 days.  Patient lives at group home/assisted living.  EMS is unable to recall the name of the assisted living.  Patient is unaware of the name of the assisted living. Patient denies chest pain, shortness of breath, abdominal pain, nausea, vomiting, numbness, tingling, dizziness, lightheadedness, vision changes, headache, urinary symptoms.  The history is provided by the patient. No language interpreter was used.       Home Medications Prior to Admission medications   Medication Sig Start Date End Date Taking? Authorizing Provider  acetaminophen (TYLENOL) 325 MG tablet Take 650 mg by mouth every 6 (six) hours as needed for mild pain, moderate pain, fever or headache.   Yes [provider]  benzonatate (TESSALON) 100 MG capsule Take 100 mg by mouth 3 (three) times daily as needed for cough.   Yes [provider]  benztropine (COGENTIN) 1 MG tablet Take 1 mg by mouth 2 (two) times daily. 03/11/19  Yes [provider]  Cholecalciferol (VITAMIN D) 50 MCG (2000 UT) CAPS Take 2,000 Units by mouth daily.   Yes [provider]  divalproex (DEPAKOTE ER) 500 MG 24 hr tablet Take 1,000 mg by mouth in the morning and at bedtime. 03/11/19  Yes [provider]  fluPHENAZine (PROLIXIN) 10 MG tablet Take 10 mg by mouth 3 (three) times daily. 03/11/19  Yes [provider]  LORazepam (ATIVAN) 1 MG tablet Take 1 mg by mouth 2 (two) times daily. 03/11/19  Yes [provider]  neomycin-polymyxin-hydrocortisone (CORTISPORIN)  OTIC solution Apply 1-2 drops to toe after soaking BID Patient not taking: Reported on 01/05/2023 04/03/19   Lenn Sink, DPM      Allergies    Abilify [aripiprazole], Ativan [lorazepam], Atorvastatin, Clozaril [clozapine], Geodon [ziprasidone hcl], and Seroquel [quetiapine fumarate]    Review of Systems   Review of Systems  Unable to perform ROS: Mental status change    Physical Exam Updated Vital Signs BP 107/70   Pulse (!) 56   Temp 97.7 F (36.5 C) (Oral)   Resp 12   SpO2 100%  Physical Exam Vitals and nursing note reviewed.  Constitutional:      General: She is not in acute distress.    Appearance: She is not diaphoretic.  HENT:     Head: Normocephalic and atraumatic.     Mouth/Throat:     Pharynx: No oropharyngeal exudate.  Eyes:     General: No scleral icterus.    Conjunctiva/sclera: Conjunctivae normal.  Cardiovascular:     Rate and Rhythm: Normal rate and regular rhythm.     Pulses: Normal pulses.     Heart sounds: Normal heart sounds.  Pulmonary:     Effort: Pulmonary effort is normal. No respiratory distress.     Breath sounds: Normal breath sounds. No wheezing.  Abdominal:     General: Bowel sounds are normal.     Palpations: Abdomen is soft. There is no mass.     Tenderness: There is no abdominal tenderness. There is no guarding or rebound.  Musculoskeletal:  General: Normal range of motion.     Cervical back: Normal range of motion and neck supple.  Skin:    General: Skin is warm and dry.  Neurological:     Mental Status: She is alert.  Psychiatric:        Behavior: Behavior normal.     ED Results / Procedures / Treatments   Labs (all labs ordered are listed, but only abnormal results are displayed) Labs Reviewed  COMPREHENSIVE METABOLIC PANEL - Abnormal; Notable for the following components:      Result Value   Creatinine, Ser 1.14 (*)    Calcium 8.8 (*)    Total Protein 5.9 (*)    Albumin 3.2 (*)    GFR, Estimated 56 (*)    All  other components within normal limits  CBC WITH DIFFERENTIAL/PLATELET - Abnormal; Notable for the following components:   Platelets 91 (*)    All other components within normal limits  URINALYSIS, ROUTINE W REFLEX MICROSCOPIC - Abnormal; Notable for the following components:   APPearance HAZY (*)    All other components within normal limits  VALPROIC ACID LEVEL - Abnormal; Notable for the following components:   Valproic Acid Lvl 116 (*)    All other components within normal limits  TROPONIN I (HIGH SENSITIVITY) - Abnormal; Notable for the following components:   Troponin I (High Sensitivity) 21 (*)    All other components within normal limits  ETHANOL  AMMONIA  RAPID URINE DRUG SCREEN, HOSP PERFORMED  CBG MONITORING, ED  TROPONIN I (HIGH SENSITIVITY)    EKG EKG Interpretation  Date/Time:  Friday January 05 2023 10:45:00 EDT Ventricular Rate:  62 PR Interval:  139 QRS Duration: 84 QT Interval:  393 QTC Calculation: 399 R Axis:   52 Text Interpretation: Sinus rhythm Duplicate EKG Confirmed by Elayne Snare ([id number]) on 01/05/2023 10:53:22 AM  Radiology CT Head Wo Contrast  Result Date: 01/05/2023 CLINICAL DATA:  Mental status change, unknown cause EXAM: CT HEAD WITHOUT CONTRAST TECHNIQUE: Contiguous axial images were obtained from the base of the skull through the vertex without intravenous contrast. RADIATION DOSE REDUCTION: This exam was performed according to the departmental dose-optimization program which includes automated exposure control, adjustment of the mA and/or kV according to patient size and/or use of iterative reconstruction technique. COMPARISON:  CT head November 05, 2003. FINDINGS: Brain: No evidence of acute infarction, hemorrhage, hydrocephalus, extra-axial collection or mass lesion/mass effect. Patchy white matter hypodensities, compatible with chronic microvascular ischemic disease. Cerebral atrophy. Partially empty sella. Vascular: No hyperdense vessel. Skull:  No acute fracture. Sinuses/Orbits: Largely clear sinuses.  No acute orbital findings. Other: No mastoid effusions IMPRESSION: 1. No evidence of acute intracranial abnormality. 2. Partially empty sella, which is often a normal anatomic variant but can be associated with idiopathic intracranial hypertension (pseudotumor cerebri). Electronically Signed   By: Feliberto Harts M.D.   On: 01/05/2023 12:00   DG Chest 2 View  Result Date: 01/05/2023 CLINICAL DATA:  Altered mental status. EXAM: CHEST - 2 VIEW COMPARISON:  None Available. FINDINGS: Normal cardiomediastinal contours. Low lung volumes. Mild increase interstitial markings noted bilaterally. No airspace consolidation or pleural effusion. The visualized osseous structures are unremarkable. IMPRESSION: 1. Low lung volumes. 2. Mild increase interstitial markings may reflect early edema or atypical infection. Electronically Signed   By: Signa Kell M.D.   On: 01/05/2023 11:31    Procedures Procedures    Medications Ordered in ED Medications  cefTRIAXone (ROCEPHIN) 1 g in sodium chloride 0.9 %  100 mL IVPB (has no administration in time range)  azithromycin (ZITHROMAX) 500 mg in sodium chloride 0.9 % 250 mL IVPB (has no administration in time range)    ED Course/ Medical Decision Making/ A&P Clinical Course as of 01/05/23 1614  Fri Jan 05, 2023  1112 Call to [facility], no response to phone call.  [SB]  1112 Phone call with administrator Amma [name] of [facility] who notes that patient is at the facility due to mental concerns and has been there since 2009. Amma notes that the patient has a baseline of delusions and altered mental status. Amma notes that she was notified of the falls today and that the patient was walking slower today. Pt typically walks unassisted. Pt has a history of paranoid schizophrenia, MDD, asthma. [SB]  1115 Amma Donzetta Starch of [employer] notes that the patient has a new legal guardian Marney Setting who can be reached at [phone] [SB]  1225 Patient noted to be shuffling in the hallway.  Patient redirected to the room.  Spoke with, Sharyon Medicus who also spoke with patient and notes that patient speech is slurred and that is not typical for her typical baseline of altered mental status/delusions per [employer].  Patient also noted to have removed her IV.  Secondary school teacher ordered. [SB]  1509 Consult with hospitalist, Dr. Katrinka Blazing who will evaluate the patient for admission.  [SB]  1540 Consult with Poison control, Angelique Blonder who recommends trending valproic acid every 4-6 hours until it is within our normal range (50-100) and hydrate the patient. Also recommended not restarting the Depakote until levels within normal range. also no need for charcoal at this time per poison control  [SB]    Clinical Course User Index [SB] Melvinia Ashby A, PA-C                             Medical Decision Making Amount and/or Complexity of Data Reviewed Labs: ordered. Radiology: ordered.  Risk Decision regarding hospitalization.   Pt presents with concerns for unsteady gait and frequent falls.  Also concerning for altered mental status onset 5 days.  On exam.  Patient able to ambulate without assistance or difficulty with a shuffling gait.  Strength and sensation intact to bilateral upper and lower extremities.  Grip strength is 5/5.  Unable to assess for pronator drift as patient notes "I am scared I do not want to close my eyes".  No acute cardiovascular, respiratory, down exam findings.  Differential diagnosis includes CVA, TIA, electrolyte abnormality, Depakote level abnormality, hypoglycemia, arrhythmia, intracranial abnormality, acute cystitis, encephalopathy.   Co morbidities that complicate the patient evaluation: Paranoid schizophrenia  Additional history obtained:  Additional history obtained from caregiver Amma [employer]:  I ordered, and personally  interpreted labs.  The pertinent results include:     Imaging: I ordered imaging studies including Chest x-ray CT head without I independently visualized and interpreted imaging which showed: CXR with concerns for atypical infection. CT head without acute intracranial abnormalities.  I agree with the radiologist interpretation  Medications:  I ordered medication including rocephin, azithromycin for PNA treatment I have reviewed the patients home medicines and have made adjustments as needed   Consultations: I requested consultation with the Hospitalist, Dr. Katrinka Blazing and discussed lab and imaging findings as well as pertinent plan - they recommend: will evaluate for admission.   Disposition: Presentation suspicious for  atypical pneumonia and AMS. Doubt concerns at this time for hypoglycemia, arrhythmia, intracranial abnormality, acute cystitis, encephalopathy, Depakote level abnormality, CVA, TIA. After consideration of the diagnostic results and the patients response to treatment, I feel that the patient would benefit from Admission to the hospital. Discussed with patient administrator and Legal guardian regarding plans for admission. Soil scientist Guardian agreeable to admission at time.    This chart was dictated using voice recognition software, Dragon. Despite the best efforts of this provider to proofread and correct errors, errors may still occur which can change documentation meaning.   Final Clinical Impression(s) / ED Diagnoses Final diagnoses:  Altered mental status, unspecified altered mental status type    Rx / DC Orders ED Discharge Orders     None         Abegail Kloeppel A, PA-C 01/05/23 1853    Rexford Maus, DO 01/09/23 1455

## 2023-01-05 NOTE — ED Notes (Signed)
Pt removed PIV from arm and allowed this RN to straight stick for labs.

## 2023-01-05 NOTE — ED Notes (Signed)
Pt refuses to allow this RN to place line and give antibiotics. Secondary RN recruited to assist.

## 2023-01-05 NOTE — ED Triage Notes (Signed)
Pt BIB [facility] from assisted living/group home off of [address]. Facility reported pt has had and unsteady gait and frequent falls since Sunday and slower to respond. No injuries reported from falls. Pt had change in depakote med in Feb. Pt is normally independent at baseline. EMS reported a shuffling gait, their stroke screen negative.   BP 100/60 HR 68 95% room air CBG 112

## 2023-01-05 NOTE — ED Notes (Signed)
Pt refuses to allow this RN to place line and administer antibiotics, will attempt again.

## 2023-01-05 NOTE — Progress Notes (Signed)
Patient admitted from the ed with a sitter. Patient is somewhat cooperative and confused. Patient pulling at lines and leads. Patient refuses to remove her clothes and put a gown on. MP shows nsr.

## 2023-01-05 NOTE — Progress Notes (Signed)
Unable to complete admission due to patient confused and does not answer appropriately.

## 2023-01-06 DIAGNOSIS — F3189 Other bipolar disorder: Secondary | ICD-10-CM

## 2023-01-06 DIAGNOSIS — F200 Paranoid schizophrenia: Secondary | ICD-10-CM | POA: Diagnosis present

## 2023-01-06 DIAGNOSIS — G928 Other toxic encephalopathy: Secondary | ICD-10-CM | POA: Diagnosis present

## 2023-01-06 DIAGNOSIS — D696 Thrombocytopenia, unspecified: Secondary | ICD-10-CM | POA: Diagnosis present

## 2023-01-06 DIAGNOSIS — R7989 Other specified abnormal findings of blood chemistry: Secondary | ICD-10-CM | POA: Diagnosis present

## 2023-01-06 DIAGNOSIS — T426X5A Adverse effect of other antiepileptic and sedative-hypnotic drugs, initial encounter: Secondary | ICD-10-CM | POA: Diagnosis present

## 2023-01-06 DIAGNOSIS — E876 Hypokalemia: Secondary | ICD-10-CM | POA: Diagnosis not present

## 2023-01-06 DIAGNOSIS — N179 Acute kidney failure, unspecified: Secondary | ICD-10-CM | POA: Diagnosis not present

## 2023-01-06 DIAGNOSIS — F17290 Nicotine dependence, other tobacco product, uncomplicated: Secondary | ICD-10-CM | POA: Diagnosis present

## 2023-01-06 DIAGNOSIS — Z888 Allergy status to other drugs, medicaments and biological substances status: Secondary | ICD-10-CM | POA: Diagnosis not present

## 2023-01-06 DIAGNOSIS — R4781 Slurred speech: Secondary | ICD-10-CM | POA: Diagnosis present

## 2023-01-06 DIAGNOSIS — E7800 Pure hypercholesterolemia, unspecified: Secondary | ICD-10-CM | POA: Diagnosis present

## 2023-01-06 DIAGNOSIS — E860 Dehydration: Secondary | ICD-10-CM | POA: Diagnosis present

## 2023-01-06 DIAGNOSIS — Z79899 Other long term (current) drug therapy: Secondary | ICD-10-CM | POA: Diagnosis not present

## 2023-01-06 DIAGNOSIS — R296 Repeated falls: Secondary | ICD-10-CM | POA: Diagnosis present

## 2023-01-06 DIAGNOSIS — G9341 Metabolic encephalopathy: Secondary | ICD-10-CM | POA: Diagnosis not present

## 2023-01-06 DIAGNOSIS — R4182 Altered mental status, unspecified: Secondary | ICD-10-CM | POA: Diagnosis present

## 2023-01-06 LAB — CBC
HCT: 36.3 % (ref 36.0–46.0)
Hemoglobin: 12.4 g/dL (ref 12.0–15.0)
MCH: 32.3 pg (ref 26.0–34.0)
MCHC: 34.2 g/dL (ref 30.0–36.0)
MCV: 94.5 fL (ref 80.0–100.0)
Platelets: 92 K/uL — ABNORMAL LOW (ref 150–400)
RBC: 3.84 MIL/uL — ABNORMAL LOW (ref 3.87–5.11)
RDW: 13.2 % (ref 11.5–15.5)
WBC: 4.8 K/uL (ref 4.0–10.5)
nRBC: 0 % (ref 0.0–0.2)

## 2023-01-06 LAB — BASIC METABOLIC PANEL
Anion gap: 9 (ref 5–15)
BUN: 16 mg/dL (ref 6–20)
CO2: 25 mmol/L (ref 22–32)
Calcium: 8.2 mg/dL — ABNORMAL LOW (ref 8.9–10.3)
Chloride: 106 mmol/L (ref 98–111)
Creatinine, Ser: 1 mg/dL (ref 0.44–1.00)
GFR, Estimated: >60 mL/min (ref >60)
Glucose, Bld: 72 mg/dL (ref 70–99)
Potassium: 3.4 mmol/L — ABNORMAL LOW (ref 3.5–5.1)
Sodium: 140 mmol/L (ref 135–145)

## 2023-01-06 LAB — VALPROIC ACID LEVEL: Valproic Acid Lvl: 85 ug/mL (ref 50.0–100.0)

## 2023-01-06 LAB — HIV ANTIBODY (ROUTINE TESTING W REFLEX): HIV Screen 4th Generation wRfx: NONREACTIVE

## 2023-01-06 MED ORDER — POTASSIUM CHLORIDE CRYS ER 20 MEQ PO TBCR
40.0000 meq | EXTENDED_RELEASE_TABLET | Freq: Once | ORAL | Status: AC
  Administered 2023-01-06: 40 meq via ORAL
  Filled 2023-01-06: qty 2

## 2023-01-06 NOTE — Progress Notes (Signed)
PROGRESS NOTE                                                                                                                                                                                                             Patient Demographics:    [name], is a 57 y.o. female, DOB - [birth year], WUJ:[id number]  Outpatient Primary MD for the patient is Lupita Raider, MD    LOS - 0  Admit date - 01/05/2023    Chief Complaint  Patient presents with   Altered Mental Status       Brief Narrative (HPI from H&P)   57 y.o. female with medical history significant of HLD, schizophrenia, and tobacco abuse who presents after being noted to be altered.  Patient currently resides in a facility and had been noted to not be herself for the last 4-5 days.  She was brought to the ER where she was found to have extremely high Depakote levels causing Depakote toxicity she was admitted for further care.   Subjective:    [name] today has, No headache, No chest pain, no shortness of breath, she does not answer questions reliably and mumbles some incoherent words in between.   Assessment  & Plan :    History of schizophrenia with Depakote toxicity.  Depakote has been held, has been adequately hydrated and levels are now within therapeutic range for over 8 hours, continue to hold medication, behavior is still somewhat bizarre will involve psych as well.  Continue to monitor with sitter at bedside.  Cogentin being continued.   Nonspecific chest x-ray changes.  No cough, no fever or leukocytosis, stable procalcitonin.  Monitor.  No antibiotics needed.  Dehydration with AKI.  Resolved after IV fluids.  Minimal elevation of troponin.  Trend flat non-ACS pattern, no chest pain no EKG changes.  No further workup needed.  Hypokalemia.  Replaced.      Condition - Fair  Family Communication  :   Called father [name] [phone] on 01/06/2023  at 8:03 AM, message left Amam [phone] 01/06/2023 at 8:04 AM.  Message left   Code Status :  Full  Consults  :  Psych  PUD Prophylaxis :    Procedures  :     CT head.  Nonacute, probably anatomical variant of empty sella syndrome  Disposition Plan  :    Status is: Observation  DVT Prophylaxis  :    enoxaparin (LOVENOX) injection 40 mg Start: 01/05/23 1800     Lab Results  Component Value Date   PLT 92 (L) 01/06/2023    Diet :  Diet Order             Diet Heart Room service appropriate? Yes; Fluid consistency: Thin  Diet effective now                    Inpatient Medications  Scheduled Meds:  benztropine  1 mg Oral BID   enoxaparin (LOVENOX) injection  40 mg Subcutaneous Q24H   fluPHENAZine  10 mg Oral TID   LORazepam  1 mg Oral BID   sodium chloride flush  3 mL Intravenous Q12H   Continuous Infusions:  sodium chloride 75 mL/hr at 01/06/23 0619   PRN Meds:.acetaminophen **OR** acetaminophen, albuterol, ondansetron **OR** ondansetron (ZOFRAN) IV  Antibiotics  :    Anti-infectives (From admission, onward)    Start     Dose/Rate Route Frequency Ordered Stop   01/05/23 1445  cefTRIAXone (ROCEPHIN) 1 g in sodium chloride 0.9 % 100 mL IVPB        1 g 200 mL/hr over 30 Minutes Intravenous  Once 01/05/23 1435 01/05/23 1848   01/05/23 1445  azithromycin (ZITHROMAX) 500 mg in sodium chloride 0.9 % 250 mL IVPB        500 mg 250 mL/hr over 60 Minutes Intravenous  Once 01/05/23 1435 01/05/23 1730         Objective:   Vitals:   01/05/23 1900 01/05/23 1921 01/06/23 0043 01/06/23 0044  BP:  124/62  102/70  Pulse:  63 (!) 57 (!) 55  Resp: Temp: 98.1 F (36.7 C) (!) 97.5 F (36.4 C)  97.8 F (36.6 C)  TempSrc: Oral Oral  Axillary  SpO2: 98% 97% 93% 92%  Height:        Wt Readings from Last 3 Encounters:  03/10/12 89.8 kg     Intake/Output Summary (Last 24 hours) at 01/06/2023 0800 Last data filed at 01/05/2023 1800 Gross  per 24 hour  Intake 776.93 ml  Output --  Net 776.93 ml     Physical Exam  Awake, mildly confused, somewhat bizarre behavior with tangential thinking and irrelevant answers, no focal deficits [place].AT,PERRAL Supple Neck, No JVD,   Symmetrical Chest wall movement, Good air movement bilaterally, CTAB RRR,No Gallops,Rubs or new Murmurs,  +ve B.Sounds, Abd Soft, No tenderness,   No Cyanosis, Clubbing or edema         Data Review:    Recent Labs  Lab 01/05/23 1104 01/06/23 0414  WBC 5.7 4.8  HGB 14.0 12.4  HCT 44.0 36.3  PLT 91* 92*  MCV 99.8 94.5  MCH 31.7 32.3  MCHC 31.8 34.2  RDW 13.1 13.2  LYMPHSABS 1.4  --   MONOABS 0.7  --   EOSABS 0.0  --   BASOSABS 0.0  --     Recent Labs  Lab 01/05/23 1104 01/05/23 1307 01/05/23 1707 01/06/23 0414  NA 140  --   --  140  K 4.1  --   --  3.4*  CL 108  --   --  106  CO2 23  --   --  25  ANIONGAP 9  --   --  9  GLUCOSE 79  --   --  72  BUN  16  --   --  16  CREATININE 1.14*  --   --  1.00  AST 24  --   --   --   ALT 15  --   --   --   ALKPHOS 50  --   --   --   BILITOT 0.7  --   --   --   ALBUMIN 3.2*  --   --   --   PROCALCITON  --   --  <0.10  --   AMMONIA  --  22  --   --   BNP  --   --  41.7  --   CALCIUM 8.8*  --   --  8.2*     Radiology Reports CT Head Wo Contrast  Result Date: 01/05/2023 CLINICAL DATA:  Mental status change, unknown cause EXAM: CT HEAD WITHOUT CONTRAST TECHNIQUE: Contiguous axial images were obtained from the base of the skull through the vertex without intravenous contrast. RADIATION DOSE REDUCTION: This exam was performed according to the departmental dose-optimization program which includes automated exposure control, adjustment of the mA and/or kV according to patient size and/or use of iterative reconstruction technique. COMPARISON:  CT head November 05, 2003. FINDINGS: Brain: No evidence of acute infarction, hemorrhage, hydrocephalus, extra-axial collection or mass lesion/mass effect.  Patchy white matter hypodensities, compatible with chronic microvascular ischemic disease. Cerebral atrophy. Partially empty sella. Vascular: No hyperdense vessel. Skull: No acute fracture. Sinuses/Orbits: Largely clear sinuses.  No acute orbital findings. Other: No mastoid effusions IMPRESSION: 1. No evidence of acute intracranial abnormality. 2. Partially empty sella, which is often a normal anatomic variant but can be associated with idiopathic intracranial hypertension (pseudotumor cerebri). Electronically Signed   By: Feliberto Harts M.D.   On: 01/05/2023 12:00   DG Chest 2 View  Result Date: 01/05/2023 CLINICAL DATA:  Altered mental status. EXAM: CHEST - 2 VIEW COMPARISON:  None Available. FINDINGS: Normal cardiomediastinal contours. Low lung volumes. Mild increase interstitial markings noted bilaterally. No airspace consolidation or pleural effusion. The visualized osseous structures are unremarkable. IMPRESSION: 1. Low lung volumes. 2. Mild increase interstitial markings may reflect early edema or atypical infection. Electronically Signed   By: Signa Kell M.D.   On: 01/05/2023 11:31      Signature  -   Susa Raring M.D on 01/06/2023 at 8:00 AM   -  To page go to [email or web address]

## 2023-01-06 NOTE — Evaluation (Signed)
Occupational Therapy Evaluation Patient Details Name: [name] MRN: [id number] DOB: 01-Mar-1966 Today's Date: 01/06/2023   History of Present Illness Pt is a 57 y.o. female who presented 01/05/23 with AMS, frequent falls, and slurred speech. Imagining with no evidence of acute intracranial abnormality. Admitted with acute metabolic encephalopathy and gait disturbance secondary to Depakote toxicity. PMH: HLD, schizophrenia, and tobacco abuse   Clinical Impression   Pt was in bed and no family present but attempted to call to get PLOF. Pt at the start of session was noted to follow one step cues inconsistently, limited eye contact and appeared to be talking about her family but very unclear. Once pt was up into a sitting position noted to increase in ability to follow one step cues with reporting therapist. Pt used RW on first attempt but had difficulties with use and when given hand held assist noted to have increase in shuffling gait. Pt requires constant direction in session to complete tasks and requires constant supervision. Pt was able to complete oral care and face washing post set up and verbal instructions in standing at sink level.  However, when back into sitting noted to start to trail off in thoughts and asking abut if her grandmother was out in the hall.      Recommendations for follow up therapy are one component of a multi-disciplinary discharge planning process, led by the attending physician.  Recommendations may be updated based on patient status, additional functional criteria and insurance authorization.   Assistance Recommended at Discharge Frequent or constant Supervision/Assistance  Patient can return home with the following A little help with walking and/or transfers;A little help with bathing/dressing/bathroom;Assistance with cooking/housework;Assistance with feeding;Direct supervision/assist for medications management;Direct supervision/assist for financial  management;Assist for transportation    Functional Status Assessment  Patient has had a recent decline in their functional status and demonstrates the ability to make significant improvements in function in a reasonable and predictable amount of time.  Equipment Recommendations   (TBD, attempted to call family in session)    Recommendations for Other Services       Precautions / Restrictions Precautions Precautions: Fall Restrictions Weight Bearing Restrictions: No      Mobility Bed Mobility Overal bed mobility: Needs Assistance Bed Mobility: Supine to Sit     Supine to sit: Min assist     General bed mobility comments: Pt needed increase in time to follow cues and multiple attempts    Transfers Overall transfer level: Needs assistance Equipment used: Rolling walker (2 wheels), 1 person hand held assist (Pt took walker the first attempt but had difficulties with coordination but when given hand held assist noted to have an icnrease in shuffling gait.) Transfers: Sit to/from Stand Sit to Stand: Min guard           General transfer comment: Pt on first attempt took 3 trials to complete      Balance Overall balance assessment: Needs assistance Sitting-balance support: No upper extremity supported, Feet supported Sitting balance-Leahy Scale: Fair Sitting balance - Comments: able to reach down to attempt to don socks   Standing balance support: Bilateral upper extremity supported, No upper extremity supported Standing balance-Leahy Scale: Fair                             ADL either performed or assessed with clinical judgement   ADL Overall ADL's : Needs assistance/impaired Eating/Feeding: Set up;Sitting Eating/Feeding Details (indicate cue type and reason):  noted when presented infront of pt they would reach out and drink in session Grooming: Oral care;Min guard;Standing   Upper Body Bathing: Min guard;Sitting   Lower Body Bathing: Minimal  assistance;Sit to/from stand Lower Body Bathing Details (indicate cue type and reason): Pt in room still would become distracted and needed redirection to task Upper Body Dressing : Min guard   Lower Body Dressing: Minimal assistance;Sit to/from stand   Toilet Transfer: Lawyer and Hygiene: Minimal assistance   Tub/ Engineer, structural: Walk-in shower;Minimal assistance   Functional mobility during ADLs: Min guard;Cueing for safety;Cueing for sequencing       Vision         Perception     Praxis      Pertinent Vitals/Pain Pain Assessment Pain Assessment: Faces     Hand Dominance     Extremity/Trunk Assessment Upper Extremity Assessment Upper Extremity Assessment: Overall WFL for tasks assessed   Lower Extremity Assessment Lower Extremity Assessment: Defer to PT evaluation   Cervical / Trunk Assessment Cervical / Trunk Assessment: Normal   Communication Communication Communication: No difficulties   Cognition Arousal/Alertness: Awake/alert Behavior During Therapy: Flat affect Overall Cognitive Status: No family/caregiver present to determine baseline cognitive functioning                                 General Comments: Pt has a hx of deluisions. Pt in the begning of session was talking to this reporting therapist but difficult to understand. Pt would also look past therapist and very limited eye contact and appeared. Pt then later in session was able to report that they are in a hospital after telling pt prior. Pt then towards the end of session reported "is that my grandma out there?"     General Comments       Exercises     Shoulder Instructions      Home Living Family/patient expects to be discharged to::  (Per nursing and chart group home. Attempted to call son but responce)                                        Prior Functioning/Environment Prior Level of Function : Needs  assist;Patient poor historian/Family not available             Mobility Comments: Per chart : "Normally patient can ambulate without assistance, has clear speech, and can function independently. She chronically has delusions at baseline." Pt unable to answer questions, often drifting off to her own conversations ADLs Comments: Likely needed assist for medication management due to baseline cognitive deficits        OT Problem List: Decreased activity tolerance;Impaired balance (sitting and/or standing)      OT Treatment/Interventions: Self-care/ADL training;DME and/or AE instruction;Therapeutic activities;Patient/family education;Balance training    OT Goals(Current goals can be found in the care plan section) Acute Rehab OT Goals Patient Stated Goal: none reported OT Goal Formulation: Patient unable to participate in goal setting Time For Goal Achievement: 01/20/23 Potential to Achieve Goals: Good  OT Frequency: Min 2X/week    Co-evaluation              AM-PAC OT "6 Clicks" Daily Activity     Outcome Measure Help from another person eating meals?: A Little Help from another person taking care of personal grooming?:  A Little Help from another person toileting, which includes using toliet, bedpan, or urinal?: A Little Help from another person bathing (including washing, rinsing, drying)?: A Little Help from another person to put on and taking off regular upper body clothing?: A Little Help from another person to put on and taking off regular lower body clothing?: A Little 6 Click Score: 18   End of Session Equipment Utilized During Treatment: Gait belt Nurse Communication: Mobility status  Activity Tolerance: Patient tolerated treatment well Patient left: in bed;with call bell/phone within reach;with nursing/sitter in room  OT Visit Diagnosis: Unsteadiness on feet (R26.81);Other abnormalities of gait and mobility (R26.89);Repeated falls (R29.6);Muscle weakness  (generalized) (M62.81)                Time: 1400-1440 OT Time Calculation (min): 40 min Charges:  OT General Charges $OT Visit: 1 Visit OT Evaluation $OT Eval Low Complexity: 1 Low OT Treatments $Self Care/Home Management : 23-37 mins  Alphia Moh OTR/L  Acute Rehab Services  [phone] office number    Alphia Moh 01/06/2023, 2:53 PM

## 2023-01-06 NOTE — Consult Note (Signed)
[facility] Face-to-Face Psychiatry Consult   Reason for Consult:  Medication management and VPA toxicity Referring Physician:  Dr. Thedore Mins Patient Identification: [name] MRN:  [id number] Principal Diagnosis: Acute metabolic encephalopathy Diagnosis:  Principal Problem:   Acute metabolic encephalopathy Active Problems:   Valproic acid toxicity   Gait disturbance   Elevated troponin   Acute kidney injury   Abnormal chest x-ray   Thrombocytopenia   Schizophrenia   Other bipolar disorder   Total Time spent with patient: 1 hour  Subjective:   [name] is a 57 y.o. female patient admitted with  Chief Complaint  Patient presents with   Altered Mental Status   .  HPI:   Per Primary Team: [name] is a 57 y.o. female with medical history significant of HLD, schizophrenia, and tobacco abuse who presents after being noted to be altered.  Patient currently resides in a facility and had been noted to not be herself for the last 4-5 days.  Reported to be shuffling gait with frequent falls, slow to respond, and having slurred speech.  Normally patient can ambulate without assistance, has clear speech, and can function independently. She chronically has delusions at baseline. Patient denies any shortness of breath, cough, chest pain, nausea, vomiting, diarrhea, or dysuria symptoms.  The patient's dose of Depakote had recently been increased about a month ago.   In the emergency department patient was noted to be afebrile with pulse 59-63, and all other vital signs maintained.  Labs noted platelets 91, BUN 16, creatinine 1.14, ammonia 22, and high-sensitivity troponin 17->21.  Chest x-ray noted mild increased interstitial markings concerning for early edema or atypical infection.  Urinalysis without signs of infection.  Patient was started on empiric antibiotics of Rocephin and azithromycin.  On Interview: Patient seen laying in bed on my approach this afternoon. She is unsure why she is  currently in the hospital and the patient is disoriented to place, time, and situation. She initially reports that she does not receive any mental health treatment but eventually admits to being on Depakote and multiple antipsychotics in the past. She states that she has allergies to many antipsychotics and Depakote has been the best medication for her.   She denies any paranoid thoughts about the hospital staff or the facility she lives at. She denies any SI/HI/AVH.  Collateral Joya San [phone] caregiver She reports that the patient has been stable on Depakote 750 mg PO BID for multiple years. She recently had her dose increased 11/28/22 to 1000 mg PO BID. At baseline the patient is pleasantly delusional and she can ambulate without difficulty. The patient started having issues with slurred speech and difficulty walking which prompted her to be taken to the hospital. She believes that getting the patient on her normal dose of Depakote would be best for her and she plans on discussing this with the patient's outpatient psychiatrist once the patient has been discharged from the hospital.  Past Psychiatric History: See above  Risk to Self:   No Risk to Others:   No Prior Inpatient Therapy:   Yes Prior Outpatient Therapy:   Yes  Past Medical History:  Past Medical History:  Diagnosis Date   Hypercholesteremia    Schizophrenia    Tobacco dependence     Past Surgical History:  Procedure Laterality Date   BREAST CYST ASPIRATION     right/ unsure when   CESAREAN SECTION     Family Psychiatric  History:  Social History:  Social  History   Substance and Sexual Activity  Alcohol Use No     Social History   Substance and Sexual Activity  Drug Use Yes    Social History   Socioeconomic History   Marital status: Single    Spouse name: Not on file   Number of children: Not on file   Years of education: Not on file   Highest education level: Not on file  Occupational History    Not on file  Tobacco Use   Smoking status: Every Day    Packs/day: .02    Types: Cigars, Cigarettes   Smokeless tobacco: Never  Substance and Sexual Activity   Alcohol use: No   Drug use: Yes   Sexual activity: Never  Other Topics Concern   Not on file  Social History Narrative   Not on file   Social Determinants of Health   Financial Resource Strain: Not on file  Food Insecurity: Not on file  Transportation Needs: Not on file  Physical Activity: Not on file  Stress: Not on file  Social Connections: Not on file   Additional Social History:    Allergies:   Allergies  Allergen Reactions   Abilify [Aripiprazole] Other (See Comments)    Unknown.    Ativan [Lorazepam] Other (See Comments)    Unknown.   Atorvastatin Other (See Comments)    Unknown.    Clozaril [Clozapine] Other (See Comments)    Unknown.    Geodon [Ziprasidone Hcl] Other (See Comments)    Unknown.    Seroquel [Quetiapine Fumarate] Other (See Comments)    Unknown reaction.     Labs:  Results for orders placed or performed during the hospital encounter of 01/05/23 (from the past 48 hour(s))  Comprehensive metabolic panel     Status: Abnormal   Collection Time: 01/05/23 11:04 AM  Result Value Ref Range   Sodium 140 135 - 145 mmol/L   Potassium 4.1 3.5 - 5.1 mmol/L   Chloride 108 98 - 111 mmol/L   CO2 23 22 - 32 mmol/L   Glucose, Bld 79 70 - 99 mg/dL    Comment: Glucose reference range applies only to samples taken after fasting for at least 8 hours.   BUN 16 6 - 20 mg/dL   Creatinine, Ser 1.61 (H) 0.44 - 1.00 mg/dL   Calcium 8.8 (L) 8.9 - 10.3 mg/dL   Total Protein 5.9 (L) 6.5 - 8.1 g/dL   Albumin 3.2 (L) 3.5 - 5.0 g/dL   AST 24 15 - 41 U/L   ALT 15 0 - 44 U/L   Alkaline Phosphatase 50 38 - 126 U/L   Total Bilirubin 0.7 0.3 - 1.2 mg/dL   GFR, Estimated 56 (L) >60 mL/min    Comment: (NOTE) Calculated using the CKD-EPI Creatinine Equation (2021)    Anion gap 9 5 - 15    Comment: Performed at  [facility], [address], [place], [place] [address]  CBC with Differential     Status: Abnormal   Collection Time: 01/05/23 11:04 AM  Result Value Ref Range   WBC 5.7 4.0 - 10.5 K/uL   RBC 4.41 3.87 - 5.11 MIL/uL   Hemoglobin 14.0 12.0 - 15.0 g/dL   HCT 54.0 98.1 - 19.1 %   MCV 99.8 80.0 - 100.0 fL   MCH 31.7 26.0 - 34.0 pg   MCHC 31.8 30.0 - 36.0 g/dL   RDW 47.8 29.5 - 62.1 %   Platelets 91 (L) 150 -  400 K/uL    Comment: Immature Platelet Fraction may be clinically indicated, consider ordering this additional test [id number] REPEATED TO VERIFY    nRBC 0.0 0.0 - 0.2 %   Neutrophils Relative % 61 %   Neutro Abs 3.6 1.7 - 7.7 K/uL   Lymphocytes Relative 24 %   Lymphs Abs 1.4 0.7 - 4.0 K/uL   Monocytes Relative 12 %   Monocytes Absolute 0.7 0.1 - 1.0 K/uL   Eosinophils Relative 1 %   Eosinophils Absolute 0.0 0.0 - 0.5 K/uL   Basophils Relative 1 %   Basophils Absolute 0.0 0.0 - 0.1 K/uL   Immature Granulocytes 1 %   Abs Immature Granulocytes 0.04 0.00 - 0.07 K/uL    Comment: Performed at [facility], [address], [place], [place] [address]  Troponin I (High Sensitivity)     Status: None   Collection Time: 01/05/23 11:04 AM  Result Value Ref Range   Troponin I (High Sensitivity) 17 <18 ng/L    Comment: (NOTE) Elevated high sensitivity troponin I (hsTnI) values and significant  changes across serial measurements may suggest ACS but many other  chronic and acute conditions are known to elevate hsTnI results.  Refer to the "Links" section for chest pain algorithms and additional  guidance. Performed at [facility], [address] [place], [place] [address]   Valproic acid level     Status: Abnormal   Collection Time: 01/05/23 11:04 AM  Result Value Ref Range   Valproic Acid Lvl 116 (H) 50.0 - 100.0 ug/mL    Comment: Performed at [facility], [address] [place], [place] [address]  CBG monitoring, ED     Status: None   Collection Time:  01/05/23 11:51 AM  Result Value Ref Range   Glucose-Capillary 82 70 - 99 mg/dL    Comment: Glucose reference range applies only to samples taken after fasting for at least 8 hours.  Troponin I (High Sensitivity)     Status: Abnormal   Collection Time: 01/05/23  1:07 PM  Result Value Ref Range   Troponin I (High Sensitivity) 21 (H) <18 ng/L    Comment: (NOTE) Elevated high sensitivity troponin I (hsTnI) values and significant  changes across serial measurements may suggest ACS but many other  chronic and acute conditions are known to elevate hsTnI results.  Refer to the "Links" section for chest pain algorithms and additional  guidance. Performed at [facility], [address], [place], [place] [address]   Ethanol     Status: None   Collection Time: 01/05/23  1:07 PM  Result Value Ref Range   Alcohol, Ethyl (B) <10 <10 mg/dL    Comment: (NOTE) Lowest detectable limit for serum alcohol is 10 mg/dL.  For medical purposes only. Performed at [facility], [address], [place], [place] [address]   Ammonia     Status: None   Collection Time: 01/05/23  1:07 PM  Result Value Ref Range   Ammonia 22 9 - 35 umol/L    Comment: Performed at [facility], [address] [place], [place] [address]  Urinalysis, Routine w reflex microscopic -Urine, Clean Catch     Status: Abnormal   Collection Time: 01/05/23  2:20 PM  Result Value Ref Range   Color, Urine YELLOW YELLOW   APPearance HAZY (A) CLEAR   Specific Gravity, Urine 1.011 1.005 - 1.030   pH 6.0 5.0 - 8.0  Glucose, UA NEGATIVE NEGATIVE mg/dL   Hgb urine dipstick NEGATIVE NEGATIVE   Bilirubin Urine NEGATIVE NEGATIVE   Ketones, ur NEGATIVE NEGATIVE mg/dL   Protein, ur NEGATIVE NEGATIVE mg/dL   Nitrite NEGATIVE NEGATIVE   Leukocytes,Ua NEGATIVE NEGATIVE    Comment: Performed at [facility], [address], [place], [place] [address]  Urine rapid drug screen (hosp performed)     Status: Abnormal    Collection Time: 01/05/23  2:20 PM  Result Value Ref Range   Opiates NONE DETECTED NONE DETECTED   Cocaine NONE DETECTED NONE DETECTED   Benzodiazepines POSITIVE (A) NONE DETECTED   Amphetamines NONE DETECTED NONE DETECTED   Tetrahydrocannabinol NONE DETECTED NONE DETECTED   Barbiturates NONE DETECTED NONE DETECTED    Comment: (NOTE) DRUG SCREEN FOR MEDICAL PURPOSES ONLY.  IF CONFIRMATION IS NEEDED FOR ANY PURPOSE, NOTIFY LAB WITHIN 5 DAYS.  LOWEST DETECTABLE LIMITS FOR URINE DRUG SCREEN Drug Class                     Cutoff (ng/mL) Amphetamine and metabolites    1000 Barbiturate and metabolites    200 Benzodiazepine                 200 Opiates and metabolites        300 Cocaine and metabolites        300 THC                            50 Performed at [facility], [address], [place], [place] [address]   Procalcitonin     Status: None   Collection Time: 01/05/23  5:07 PM  Result Value Ref Range   Procalcitonin <0.10 ng/mL    Comment:        Interpretation: PCT (Procalcitonin) <= 0.5 ng/mL: Systemic infection (sepsis) is not likely. Local bacterial infection is possible. (NOTE)       Sepsis PCT Algorithm           Lower Respiratory Tract                                      Infection PCT Algorithm    ----------------------------     ----------------------------         PCT < 0.25 ng/mL                PCT < 0.10 ng/mL          Strongly encourage             Strongly discourage   discontinuation of antibiotics    initiation of antibiotics    ----------------------------     -----------------------------       PCT 0.25 - 0.50 ng/mL            PCT 0.10 - 0.25 ng/mL               OR       >80% decrease in PCT            Discourage initiation of                                            antibiotics      Encourage discontinuation  of antibiotics    ----------------------------     -----------------------------         PCT >= 0.50 ng/mL              PCT  0.26 - 0.50 ng/mL               AND        <80% decrease in PCT             Encourage initiation of                                             antibiotics       Encourage continuation           of antibiotics    ----------------------------     -----------------------------        PCT >= 0.50 ng/mL                  PCT > 0.50 ng/mL               AND         increase in PCT                  Strongly encourage                                      initiation of antibiotics    Strongly encourage escalation           of antibiotics                                     -----------------------------                                           PCT <= 0.25 ng/mL                                                 OR                                        > 80% decrease in PCT                                      Discontinue / Do not initiate                                             antibiotics  Performed at [facility], [address] [place], [place] [address]   Valproic acid level     Status: Abnormal   Collection Time: 01/05/23  5:07 PM  Result Value Ref Range   Valproic Acid Lvl 116 (H)  50.0 - 100.0 ug/mL    Comment: Performed at [facility], [address] [place], [place] [address]  Brain natriuretic peptide     Status: None   Collection Time: 01/05/23  5:07 PM  Result Value Ref Range   B Natriuretic Peptide 41.7 0.0 - 100.0 pg/mL    Comment: Performed at [facility], [address], [place], [place] [address]  Valproic acid level     Status: None   Collection Time: 01/05/23  9:54 PM  Result Value Ref Range   Valproic Acid Lvl 94 50.0 - 100.0 ug/mL    Comment: Performed at [facility], [address], [place], [place] [address]  HIV Antibody (routine testing w rflx)     Status: None   Collection Time: 01/05/23  9:54 PM  Result Value Ref Range   HIV Screen 4th Generation wRfx Non Reactive Non Reactive    Comment: Performed at [facility],  [address] [place], [place] [address]  CBC     Status: Abnormal   Collection Time: 01/06/23  4:14 AM  Result Value Ref Range   WBC 4.8 4.0 - 10.5 K/uL   RBC 3.84 (L) 3.87 - 5.11 MIL/uL   Hemoglobin 12.4 12.0 - 15.0 g/dL   HCT 29.5 62.1 - 30.8 %   MCV 94.5 80.0 - 100.0 fL   MCH 32.3 26.0 - 34.0 pg   MCHC 34.2 30.0 - 36.0 g/dL   RDW 65.7 84.6 - 96.2 %   Platelets 92 (L) 150 - 400 K/uL    Comment: Immature Platelet Fraction may be clinically indicated, consider ordering this additional test [id number] REPEATED TO VERIFY    nRBC 0.0 0.0 - 0.2 %    Comment: Performed at [facility], [address] [place], [place] [address]  Basic metabolic panel     Status: Abnormal   Collection Time: 01/06/23  4:14 AM  Result Value Ref Range   Sodium 140 135 - 145 mmol/L   Potassium 3.4 (L) 3.5 - 5.1 mmol/L   Chloride 106 98 - 111 mmol/L   CO2 25 22 - 32 mmol/L   Glucose, Bld 72 70 - 99 mg/dL    Comment: Glucose reference range applies only to samples taken after fasting for at least 8 hours.   BUN 16 6 - 20 mg/dL   Creatinine, Ser 2.72 0.44 - 1.00 mg/dL   Calcium 8.2 (L) 8.9 - 10.3 mg/dL   GFR, Estimated >53 >66 mL/min    Comment: (NOTE) Calculated using the CKD-EPI Creatinine Equation (2021)    Anion gap 9 5 - 15    Comment: Performed at [facility], [address], [place], [place] [address]  Valproic acid level     Status: None   Collection Time: 01/06/23  4:14 AM  Result Value Ref Range   Valproic Acid Lvl 85 50.0 - 100.0 ug/mL    Comment: Performed at [facility], [address] [place], [place] [address]    Current Facility-Administered Medications  Medication Dose Route Frequency Provider Last Rate Last Admin   acetaminophen (TYLENOL) tablet 650 mg  650 mg Oral Q6H PRN Madelyn Flavors A, MD       Or   acetaminophen (TYLENOL) suppository 650 mg  650 mg Rectal Q6H PRN Smith, Rondell A, MD       albuterol (PROVENTIL) (2.5 MG/3ML) 0.083% nebulizer solution 2.5  mg  2.5 mg Nebulization Q6H  PRN Clydie Braun, MD       benztropine (COGENTIN) tablet 1 mg  1 mg Oral BID Madelyn Flavors A, MD   1 mg at 01/06/23 0934   enoxaparin (LOVENOX) injection 40 mg  40 mg Subcutaneous Q24H Madelyn Flavors A, MD   40 mg at 01/05/23 1809   fluPHENAZine (PROLIXIN) tablet 10 mg  10 mg Oral TID Clydie Braun, MD   10 mg at 01/06/23 0935   LORazepam (ATIVAN) tablet 1 mg  1 mg Oral BID Madelyn Flavors A, MD   1 mg at 01/06/23 0934   ondansetron (ZOFRAN) tablet 4 mg  4 mg Oral Q6H PRN Clydie Braun, MD       Or   ondansetron (ZOFRAN) injection 4 mg  4 mg Intravenous Q6H PRN Katrinka Blazing, Rondell A, MD       sodium chloride flush (NS) 0.9 % injection 3 mL  3 mL Intravenous Q12H Madelyn Flavors A, MD   3 mL at 01/06/23 0935    Psychiatric Specialty Exam:  Presentation  General Appearance:  Bizarre  Eye Contact: Fair  Speech: Garbled  Speech Volume: Normal  Handedness:No data recorded  Mood and Affect  Mood: -- (Fine)  Affect:No data recorded  Thought Process  Thought Processes: Disorganized  Descriptions of Associations:Loose  Orientation:-- (oriented to self)  Thought Content:Delusions  History of Schizophrenia/Schizoaffective disorder:No  Duration of Psychotic Symptoms:Greater than six months  Hallucinations:No data recorded Ideas of Reference:None  Suicidal Thoughts:Suicidal Thoughts: No  Homicidal Thoughts:Homicidal Thoughts: No   Sensorium  Memory:No data recorded Judgment:No data recorded Insight:No data recorded  Executive Functions  Concentration: Poor  Attention Span: Poor  Recall: Poor  Fund of Knowledge: Poor  Language: Good   Psychomotor Activity  Psychomotor Activity:No data recorded  Assets  Assets:No data recorded  Sleep  Sleep: Sleep: Fair   Physical Exam: Physical Exam ROS Blood pressure 120/69, pulse 65, temperature 98 F (36.7 C), temperature source Oral, resp. rate 13, height 5\' 3"  (1.6 m),  SpO2 95 %. There is no height or weight on file to calculate BMI.  Treatment Plan Summary: Bipolar Disorder -Continue to hold Depakote and patient can follow up with her outpatient psychiatrist to have the medication restarted -Patient has done well on Depakote 750 mg PO BID per collateral  Disposition: No evidence of imminent risk to self or others at present.   Discussed crisis plan, support from social network, calling 911, coming to the Emergency Department, and calling Suicide Hotline.  Patient cleared from a psychiatric standpoint. Please reconsult if necessary.  Harlin Heys, DO 01/06/2023 3:37 PM

## 2023-01-06 NOTE — Evaluation (Signed)
Physical Therapy Evaluation Patient Details Name: [name] MRN: [id number] DOB: 1966/02/04 Today's Date: 01/06/2023  History of Present Illness  Pt is a 57 y.o. female who presented 01/05/23 with AMS, frequent falls, and slurred speech. Imagining with no evidence of acute intracranial abnormality. Admitted with acute metabolic encephalopathy and gait disturbance secondary to Depakote toxicity. PMH: HLD, schizophrenia, and tobacco abuse   Clinical Impression  Pt presents with condition above and deficits mentioned below, see PT Problem List. No family/caregiver present,  limited info available in chart, and pt is a poor historian, not really answering questions due to trailing off with her delusions this session. However, per chart, pt resides in a group home/ALF off of [address], potentially [facility]? Per chart, pt was independent functionally, ambulating without assistance and chronically has delusions at baseline. At this time, pt is restless with her lines, hallucinating, distracted by her delusions, and demonstrating poor safety awareness, refusing to use a RW and needing cues to return to room for safety as she fatigued. Pt is demonstrating deficits in dynamic sitting balance, static and dynamic standing balance, power, gross strength, cognition, and activity tolerance. Pt required up to minA to ambulate without UE support, displaying increased knee flexion and more difficulty with shuffling her feet as she fatigues. She is at high risk for falls. If her ALF/group home can provide the level of assistance she needs, then she could return there with therapy follow-up. If not, then may need to consider short-term inpatient rehab, <3 hours/day. Will continue to follow acutely.     Recommendations for follow up therapy are one component of a multi-disciplinary discharge planning process, led by the attending physician.  Recommendations may be updated based on patient status, additional  functional criteria and insurance authorization.  Follow Up Recommendations       Assistance Recommended at Discharge Frequent or constant Supervision/Assistance  Patient can return home with the following  A little help with walking and/or transfers;A lot of help with bathing/dressing/bathroom;Assistance with cooking/housework;Direct supervision/assist for medications management;Direct supervision/assist for financial management;Assist for transportation;Help with stairs or ramp for entrance    Equipment Recommendations Rolling walker (2 wheels);BSC/3in1  Recommendations for Other Services  OT consult    Functional Status Assessment Patient has had a recent decline in their functional status and demonstrates the ability to make significant improvements in function in a reasonable and predictable amount of time.     Precautions / Restrictions Precautions Precautions: Fall Restrictions Weight Bearing Restrictions: No      Mobility  Bed Mobility Overal bed mobility: Needs Assistance Bed Mobility: Supine to Sit, Sit to Supine     Supine to sit: Min guard, Min assist, HOB elevated Sit to supine: Mod assist, HOB elevated   General bed mobility comments: Min guard assist with extra time and use of bed rail and HOB elevated to sit up L EOB first rep, then pt had posterior LOB sitting EOB and needed minA HHA to ascend trunk. ModA to lift legs to get pt to follow cues to return to supine.    Transfers Overall transfer level: Needs assistance Equipment used: Rolling walker (2 wheels), None Transfers: Sit to/from Stand Sit to Stand: Min guard           General transfer comment: Extra time and multiple attempts before successfully coming to stand from EOB a couple times, with and without RW. Pt often falling back on bed with first attempt. Min guard for safety    Ambulation/Gait Ambulation/Gait  assistance: Min guard, Editor, commissioning (Feet): 80 Feet Assistive device:  None Gait Pattern/deviations: Step-through pattern, Decreased stride length, Knee flexed in stance - left, Knee flexed in stance - right, Shuffle, Trunk flexed Gait velocity: reduced Gait velocity interpretation: <1.31 ft/sec, indicative of household ambulator   General Gait Details: Pt refusing to use the RW despite multiple attempts, ultimately pushing it away and stating "I won't use that. It hurts". Thus ambulated without UE support, displaying a slow, shuffling gait pattern. Min guard assist initially but as distance progressed she began to flex at her knees more and have increased difficulty advancing her feet, sliding feet on the ground, needing minA to safely return to the bed.  Stairs            Wheelchair Mobility    Modified Rankin (Stroke Patients Only) Modified Rankin (Stroke Patients Only) Pre-Morbid Rankin Score: Moderate disability Modified Rankin: Moderately severe disability     Balance Overall balance assessment: Needs assistance Sitting-balance support: No upper extremity supported, Feet supported Sitting balance-Leahy Scale: Fair Sitting balance - Comments: Able to sit statically EOB with min guard, but when leg lifted pt LOB posteriorly needing minA to recover   Standing balance support: No upper extremity supported, During functional activity Standing balance-Leahy Scale: Fair Standing balance comment: Initially min guard to ambulate but minA needed as pt became weaker and more off balance                             Pertinent Vitals/Pain Pain Assessment Pain Assessment: Faces Faces Pain Scale: Hurts a little bit Pain Location: generalized, "bruised" per pt but unable to state location Pain Descriptors / Indicators: Discomfort, Other (Comment) ("bruising") Pain Intervention(s): Limited activity within patient's tolerance, Monitored during session, Repositioned    Home Living Family/patient expects to be discharged to:: Other (Comment) (per  chart, pt was in a group home/ALF off of [address], potentially [facility]??)                        Prior Function Prior Level of Function : Needs assist;Patient poor historian/Family not available             Mobility Comments: Per chart : "Normally patient can ambulate without assistance, has clear speech, and can function independently. She chronically has delusions at baseline." Pt unable to answer questions, often drifting off to her own conversations ADLs Comments: Likely needed assist for medication management due to baseline cognitive deficits     Hand Dominance        Extremity/Trunk Assessment   Upper Extremity Assessment Upper Extremity Assessment: Defer to OT evaluation    Lower Extremity Assessment Lower Extremity Assessment: Generalized weakness;Difficult to assess due to impaired cognition (MMT scores of 5 in quads though but difficulty following cues to test other muscle groups, denied numbness/tingling)    Cervical / Trunk Assessment Cervical / Trunk Assessment: Normal  Communication   Communication: No difficulties  Cognition Arousal/Alertness: Awake/alert Behavior During Therapy: Restless Overall Cognitive Status: No family/caregiver present to determine baseline cognitive functioning                                 General Comments: Pt has baseline delusions. Pt asking if her grandmother is in the hall and thinks she hears her then later states her grandmother was killed by a dog.  Pt also stating her mother is dead. Pt laughing and stating "How are you sweetie?" across the room to the wall stating she was talking to her grandbaby. Pt reluctant to use the RW despite cues and attempts to educate her on her safety concerns. Poor awareness of her fatigue, needing cues to return to room to ensure safety. Restless playing with her lines.        General Comments      Exercises     Assessment/Plan    PT Assessment Patient  needs continued PT services  PT Problem List Decreased strength;Decreased activity tolerance;Decreased balance;Decreased mobility;Decreased cognition;Decreased coordination;Decreased knowledge of use of DME;Decreased safety awareness       PT Treatment Interventions DME instruction;Gait training;Functional mobility training;Therapeutic activities;Balance training;Therapeutic exercise;Neuromuscular re-education;Cognitive remediation;Patient/family education    PT Goals (Current goals can be found in the Care Plan section)  Acute Rehab PT Goals Patient Stated Goal: to see her grandmother PT Goal Formulation: With patient Time For Goal Achievement: 01/20/23 Potential to Achieve Goals: Good    Frequency Min 3X/week     Co-evaluation               AM-PAC PT "6 Clicks" Mobility  Outcome Measure Help needed turning from your back to your side while in a flat bed without using bedrails?: A Little Help needed moving from lying on your back to sitting on the side of a flat bed without using bedrails?: A Little Help needed moving to and from a bed to a chair (including a wheelchair)?: A Little Help needed standing up from a chair using your arms (e.g., wheelchair or bedside chair)?: A Little Help needed to walk in hospital room?: A Little Help needed climbing 3-5 steps with a railing? : Total 6 Click Score: 16    End of Session Equipment Utilized During Treatment: Gait belt Activity Tolerance: Patient tolerated treatment well Patient left: in bed;with call bell/phone within reach;with bed alarm set;with nursing/sitter in room Nurse Communication: Mobility status PT Visit Diagnosis: Unsteadiness on feet (R26.81);Other abnormalities of gait and mobility (R26.89);Muscle weakness (generalized) (M62.81);History of falling (Z91.81);Repeated falls (R29.6);Difficulty in walking, not elsewhere classified (R26.2)    Time: 3664-4034 PT Time Calculation (min) (ACUTE ONLY): 21 min   Charges:    PT Evaluation $PT Eval Moderate Complexity: 1 Mod          Raymond Gurney, PT, DPT Acute Rehabilitation Services  Office: [phone]   Jewel Baize 01/06/2023, 9:43 AM

## 2023-01-07 DIAGNOSIS — F3189 Other bipolar disorder: Secondary | ICD-10-CM

## 2023-01-07 DIAGNOSIS — G9341 Metabolic encephalopathy: Secondary | ICD-10-CM | POA: Diagnosis not present

## 2023-01-07 LAB — BASIC METABOLIC PANEL
Anion gap: 10 (ref 5–15)
BUN: 19 mg/dL (ref 6–20)
CO2: 24 mmol/L (ref 22–32)
Calcium: 8.7 mg/dL — ABNORMAL LOW (ref 8.9–10.3)
Chloride: 107 mmol/L (ref 98–111)
Creatinine, Ser: 1.08 mg/dL — ABNORMAL HIGH (ref 0.44–1.00)
GFR, Estimated: 60 mL/min — ABNORMAL LOW (ref >60)
Glucose, Bld: 69 mg/dL — ABNORMAL LOW (ref 70–99)
Potassium: 4 mmol/L (ref 3.5–5.1)
Sodium: 141 mmol/L (ref 135–145)

## 2023-01-07 MED ORDER — DIVALPROEX SODIUM 500 MG PO DR TAB
500.0000 mg | DELAYED_RELEASE_TABLET | Freq: Every day | ORAL | Status: DC
  Administered 2023-01-07: 500 mg via ORAL
  Filled 2023-01-07: qty 1

## 2023-01-07 MED ORDER — SODIUM CHLORIDE 0.9 % IV BOLUS
500.0000 mL | Freq: Once | INTRAVENOUS | Status: AC
  Administered 2023-01-07: 500 mL via INTRAVENOUS

## 2023-01-07 NOTE — Discharge Summary (Addendum)
[name] EXN:[id number] DOB: 14-May-1966 DOA: 01/05/2023  PCP: Lupita Raider, MD  Admit date: 01/05/2023  Discharge date: 01/09/2023  Admitted From: Group home   disposition: Group home   Recommendations for Outpatient Follow-up:   Follow up with PCP in 1-2 weeks  PCP Please obtain BMP/CBC, 2 view CXR in 1week,  (see Discharge instructions)   PCP Please follow up on the following pending results: Monitor valproic acid level closely, check CBC, CMP, magnesium next visit, must follow-up with her psychiatrist tomorrow and get Depakote dose adjusted.   Home Health: PT Equipment/Devices: Walker Consultations: Psych Discharge Condition: Stable    CODE STATUS: Full    Diet Recommendation: Heart Healthy     Chief Complaint  Patient presents with   Altered Mental Status     Brief history of present illness from the day of admission and additional interim summary    57 y.o. female with medical history significant of HLD, schizophrenia, and tobacco abuse who presents after being noted to be altered.  Patient currently resides in a facility and had been noted to not be herself for the last 4-5 days.  She was brought to the ER where she was found to have extremely high Depakote levels causing Depakote toxicity she was admitted for further care.                                                                  Hospital Course   History of schizophrenia with Depakote toxicity.  Depakote held levels within therapeutic levels seen by psychiatry Depakote dose adjusted multiple times, she is doing well with PT, ambulating without any assistance when I walked her in the hallway x 2 on 01/07/2023 and 01/08/2023, no walker use, unclear what her baseline is however the group home administrator thinks that she was not on her  baseline for the last 2 days, she was also concerned about patient's mental status and claimed that it was not at her baseline.  Seen by psychiatry, she is calm, although mildly delusional.  Depakote dose has been adjusted as per psych guidance.  If group home administrator thinks patient is stable enough for them to manage she will be discharged today back to group home with outpatient follow-up with PCP and her psychiatrist within 1 week of discharge.  Home PT and walker also ordered.    Nonspecific chest x-ray changes.  No cough, no fever or leukocytosis, stable procalcitonin.  Monitor.  No antibiotics needed.   Dehydration with AKI.  Resolved after IV fluids.   Minimal elevation of troponin.  Trend flat non-ACS pattern, no chest pain no EKG changes.  No further workup needed.   Hypokalemia.  Replaced.     Discharge diagnosis     Principal Problem:   Acute metabolic encephalopathy Active Problems:  Valproic acid toxicity   Gait disturbance   Elevated troponin   Acute kidney injury   Abnormal chest x-ray   Thrombocytopenia   Schizophrenia   Other bipolar disorder    Discharge instructions    Discharge Instructions     Diet - low sodium heart healthy   Complete by: As directed    Discharge instructions   Complete by: As directed    Follow with Primary MD Lupita Raider, MD in 7 days, follow-up with your psychiatrist tomorrow and get Depakote dose adjusted  Get CBC, CMP, magnesium, valproic acid level,-  checked next visit with your primary MD    Activity: As tolerated with Full fall precautions use walker/cane & assistance as needed  Disposition Home    Diet: Heart Healthy    Special Instructions: If you have smoked or chewed Tobacco  in the last 2 yrs please stop smoking, stop any regular Alcohol  and or any Recreational drug use.  On your next visit with your primary care physician please Get Medicines reviewed and adjusted.  Please request your Prim.MD to go  over all Hospital Tests and Procedure/Radiological results at the follow up, please get all Hospital records sent to your Prim MD by signing hospital release before you go home.  If you experience worsening of your admission symptoms, develop shortness of breath, life threatening emergency, suicidal or homicidal thoughts you must seek medical attention immediately by calling 911 or calling your MD immediately  if symptoms less severe.  You Must read complete instructions/literature along with all the possible adverse reactions/side effects for all the Medicines you take and that have been prescribed to you. Take any new Medicines after you have completely understood and accpet all the possible adverse reactions/side effects.   Do not drive when taking Pain medications.  Do not take more than prescribed Pain, Sleep and Anxiety Medications   Increase activity slowly   Complete by: As directed        Discharge Medications   Allergies as of 01/09/2023       Reactions   Abilify [aripiprazole] Other (See Comments)   Unknown.    Ativan [lorazepam] Other (See Comments)   Unknown.   Atorvastatin Other (See Comments)   Unknown.    Clozaril [clozapine] Other (See Comments)   Unknown.    Geodon [ziprasidone Hcl] Other (See Comments)   Unknown.    Seroquel [quetiapine Fumarate] Other (See Comments)   Unknown reaction.         Medication List     STOP taking these medications    divalproex 500 MG 24 hr tablet Commonly known as: DEPAKOTE ER Replaced by: divalproex 250 MG DR tablet   neomycin-polymyxin-hydrocortisone OTIC solution Commonly known as: CORTISPORIN       TAKE these medications    acetaminophen 325 MG tablet Commonly known as: TYLENOL Take 650 mg by mouth every 6 (six) hours as needed for mild pain, moderate pain, fever or headache.   benzonatate 100 MG capsule Commonly known as: TESSALON Take 100 mg by mouth 3 (three) times daily as needed for cough.    benztropine 1 MG tablet Commonly known as: COGENTIN Take 1 mg by mouth 2 (two) times daily.   divalproex 250 MG DR tablet Commonly known as: DEPAKOTE Take 3 tablets (750 mg total) by mouth every 12 (twelve) hours. Replaces: divalproex 500 MG 24 hr tablet   fluPHENAZine 10 MG tablet Commonly known as: PROLIXIN Take 10 mg by mouth 3 (three) times daily.  LORazepam 1 MG tablet Commonly known as: ATIVAN Take 1 mg by mouth 2 (two) times daily.   Vitamin D 50 MCG (2000 UT) Caps Take 2,000 Units by mouth daily.         Follow-up Information     Lupita Raider, MD. Schedule an appointment as soon as possible for a visit in 2 day(s).   Specialty: Family Medicine Why: Also follow-up with your psychiatrist on Monday, 01/08/2023 and get Depakote dose adjusted. Contact information: [address] [place] [address] [phone]         [facility] Follow up.   Why: [facility]  will schedule a home visit within 48 hours of discharge.  Call [facility] if you have not heard with 48 hours Contact information: [address] [place] [address] [phone]         [facility] Follow up.   Why: [facility] has provided your rolling walker Contact information: [address] [place], [place]  [address]  [phone]                Major procedures and Radiology Reports - PLEASE review detailed and final reports thoroughly  -      CT Head Wo Contrast  Result Date: 01/05/2023 CLINICAL DATA:  Mental status change, unknown cause EXAM: CT HEAD WITHOUT CONTRAST TECHNIQUE: Contiguous axial images were obtained from the base of the skull through the vertex without intravenous contrast. RADIATION DOSE REDUCTION: This exam was performed according to the departmental dose-optimization program which includes automated exposure control, adjustment of the mA and/or kV according to patient size and/or use of iterative  reconstruction technique. COMPARISON:  CT head November 05, 2003. FINDINGS: Brain: No evidence of acute infarction, hemorrhage, hydrocephalus, extra-axial collection or mass lesion/mass effect. Patchy white matter hypodensities, compatible with chronic microvascular ischemic disease. Cerebral atrophy. Partially empty sella. Vascular: No hyperdense vessel. Skull: No acute fracture. Sinuses/Orbits: Largely clear sinuses.  No acute orbital findings. Other: No mastoid effusions IMPRESSION: 1. No evidence of acute intracranial abnormality. 2. Partially empty sella, which is often a normal anatomic variant but can be associated with idiopathic intracranial hypertension (pseudotumor cerebri). Electronically Signed   By: Feliberto Harts M.D.   On: 01/05/2023 12:00   DG Chest 2 View  Result Date: 01/05/2023 CLINICAL DATA:  Altered mental status. EXAM: CHEST - 2 VIEW COMPARISON:  None Available. FINDINGS: Normal cardiomediastinal contours. Low lung volumes. Mild increase interstitial markings noted bilaterally. No airspace consolidation or pleural effusion. The visualized osseous structures are unremarkable. IMPRESSION: 1. Low lung volumes. 2. Mild increase interstitial markings may reflect early edema or atypical infection. Electronically Signed   By: Signa Kell M.D.   On: 01/05/2023 11:31    Micro Results    No results found for this or any previous visit (from the past 240 hour(s)).  Today   Subjective    [name] today has no headache,no chest abdominal pain,no new weakness tingling or numbness, feels much better wants to go home today.    Objective   Blood pressure 107/74, pulse 66, temperature 97.8 F (36.6 C), temperature source Oral, resp. rate 18, height  (1.6 m), SpO2 95 %.   Intake/Output Summary (Last 24 hours) at 01/09/2023 0818 Last data filed at 01/08/2023 2105 Gross per 24 hour  Intake 3 ml  Output --  Net 3 ml    Exam  Awake Alert, No new F.N deficits,     [place].AT,PERRAL Supple Neck,  Symmetrical Chest wall movement, Good air movement bilaterally, CTAB RRR,No Gallops,   +ve B.Sounds, Abd Soft, Non tender,  No Cyanosis, Clubbing or edema    Data Review   Recent Labs  Lab 01/05/23 1104 01/06/23 0414  WBC 5.7 4.8  HGB 14.0 12.4  HCT 44.0 36.3  PLT 91* 92*  MCV 99.8 94.5  MCH 31.7 32.3  MCHC 31.8 34.2  RDW 13.1 13.2  LYMPHSABS 1.4  --   MONOABS 0.7  --   EOSABS 0.0  --   BASOSABS 0.0  --     Recent Labs  Lab 01/05/23 1104 01/05/23 1307 01/05/23 1707 01/06/23 0414 01/07/23 0707  NA 140  --   --  140 141  K 4.1  --   --  3.4* 4.0  CL 108  --   --  106 107  CO2 23  --   --  25 24  ANIONGAP 9  --   --  9 10  GLUCOSE 79  --   --  72 69*  BUN 16  --   --  16 19  CREATININE 1.14*  --   --  1.00 1.08*  AST 24  --   --   --   --   ALT 15  --   --   --   --   ALKPHOS 50  --   --   --   --   BILITOT 0.7  --   --   --   --   ALBUMIN 3.2*  --   --   --   --   PROCALCITON  --   --  <0.10  --   --   AMMONIA  --  22  --   --   --   BNP  --   --  41.7  --   --   CALCIUM 8.8*  --   --  8.2* 8.7*    Total Time in preparing paper work, data evaluation and todays exam - 35 minutes  Signature  -    Susa Raring M.D on 01/09/2023 at 8:18 AM   -  To page go to [email or web address]

## 2023-01-07 NOTE — Consult Note (Signed)
[facility] Face-to-Face Psychiatry Consult   Reason for Consult:  Medication management and VPA toxicity Referring Physician:  Dr. Thedore Mins Patient Identification: [name] MRN:  [id number] Principal Diagnosis: Acute metabolic encephalopathy Diagnosis:  Principal Problem:   Acute metabolic encephalopathy Active Problems:   Valproic acid toxicity   Gait disturbance   Elevated troponin   Acute kidney injury   Abnormal chest x-ray   Thrombocytopenia   Schizophrenia   Other bipolar disorder   Total Time spent with patient: 1 hour  Subjective:   [name] is a 57 y.o. female patient admitted with  Chief Complaint  Patient presents with   Altered Mental Status   .  HPI:   Per Primary Team: [name] is a 57 y.o. female with medical history significant of HLD, schizophrenia, and tobacco abuse who presents after being noted to be altered.  Patient currently resides in a facility and had been noted to not be herself for the last 4-5 days.  Reported to be shuffling gait with frequent falls, slow to respond, and having slurred speech.  Normally patient can ambulate without assistance, has clear speech, and can function independently. She chronically has delusions at baseline. Patient denies any shortness of breath, cough, chest pain, nausea, vomiting, diarrhea, or dysuria symptoms.  The patient's dose of Depakote had recently been increased about a month ago.   In the emergency department patient was noted to be afebrile with pulse 59-63, and all other vital signs maintained.  Labs noted platelets 91, BUN 16, creatinine 1.14, ammonia 22, and high-sensitivity troponin 17->21.  Chest x-ray noted mild increased interstitial markings concerning for early edema or atypical infection.  Urinalysis without signs of infection.  Patient was started on empiric antibiotics of Rocephin and azithromycin.  On Interview 01/06/2023: Patient seen laying in bed on my approach this afternoon. She is unsure why  she is currently in the hospital and the patient is disoriented to place, time, and situation. She initially reports that she does not receive any mental health treatment but eventually admits to being on Depakote and multiple antipsychotics in the past. She states that she has allergies to many antipsychotics and Depakote has been the best medication for her.   She denies any paranoid thoughts about the hospital staff or the facility she lives at. She denies any SI/HI/AVH.  Collateral [name] [phone] caregiver 01/06/23 She reports that the patient has been stable on Depakote 750 mg PO BID for multiple years. She recently had her dose increased 11/28/22 to 1000 mg PO BID. At baseline the patient is pleasantly delusional and she can ambulate without difficulty. The patient started having issues with slurred speech and difficulty walking which prompted her to be taken to the hospital. She believes that getting the patient on her normal dose of Depakote would be best for her and she plans on discussing this with the patient's outpatient psychiatrist once the patient has been discharged from the hospital  01/07/2023 Patient seen laying in bed on my approach this morning. She remains delusional on examination and reports that she is her own doctor. The patient states that she does not want to live with Ms. Isaias Cowman and states that she wants to live with her father. She denies any concerns with her safety in the hospital. She is amenable to restarting Depakote.  Collateral [name] [phone] caregiver 01/06/23 Spoke with the patient's caregiver again this morning. She expresses concerns that the patient is not at her baseline. She expects  the patient to know who she is and to be willing to come back to the group home. She initially reports that the patient does not know who she is but later on in the conversation she states that the patient knows who she is but does not want to come home. In addition she  reports that she does not think that the patient can walk and she does not feel comfortable doing a weekend discharge.  She also reports concern that the patient has been off Depakote. It was explained that the patient's Depakote levels are still therapeutic and we can restart the medication at discharge. She remained resistant to the idea of the patient coming home due to concerns she may be combative. She was made aware that the patient has shown no signs of aggression in the hospital that would concern the staff with discharging her at this time.  .  Past Psychiatric History: See above  Risk to Self:   No Risk to Others:   No Prior Inpatient Therapy:   Yes Prior Outpatient Therapy:   Yes  Past Medical History:  Past Medical History:  Diagnosis Date   Hypercholesteremia    Schizophrenia    Tobacco dependence     Past Surgical History:  Procedure Laterality Date   BREAST CYST ASPIRATION     right/ unsure when   CESAREAN SECTION     Family Psychiatric  History:  Social History:  Social History   Substance and Sexual Activity  Alcohol Use No     Social History   Substance and Sexual Activity  Drug Use Yes    Social History   Socioeconomic History   Marital status: Single    Spouse name: Not on file   Number of children: Not on file   Years of education: Not on file   Highest education level: Not on file  Occupational History   Not on file  Tobacco Use   Smoking status: Every Day    Packs/day: .02    Types: Cigars, Cigarettes   Smokeless tobacco: Never  Substance and Sexual Activity   Alcohol use: No   Drug use: Yes   Sexual activity: Never  Other Topics Concern   Not on file  Social History Narrative   Not on file   Social Determinants of Health   Financial Resource Strain: Not on file  Food Insecurity: Not on file  Transportation Needs: Not on file  Physical Activity: Not on file  Stress: Not on file  Social Connections: Not on file   Additional  Social History:    Allergies:   Allergies  Allergen Reactions   Abilify [Aripiprazole] Other (See Comments)    Unknown.    Ativan [Lorazepam] Other (See Comments)    Unknown.   Atorvastatin Other (See Comments)    Unknown.    Clozaril [Clozapine] Other (See Comments)    Unknown.    Geodon [Ziprasidone Hcl] Other (See Comments)    Unknown.    Seroquel [Quetiapine Fumarate] Other (See Comments)    Unknown reaction.     Labs:  Results for orders placed or performed during the hospital encounter of 01/05/23 (from the past 48 hour(s))  Troponin I (High Sensitivity)     Status: Abnormal   Collection Time: 01/05/23  1:07 PM  Result Value Ref Range   Troponin I (High Sensitivity) 21 (H) <18 ng/L    Comment: (NOTE) Elevated high sensitivity troponin I (hsTnI) values and significant  changes across serial measurements  may suggest ACS but many other  chronic and acute conditions are known to elevate hsTnI results.  Refer to the "Links" section for chest pain algorithms and additional  guidance. Performed at [facility], [address], [place], [place] [address]   Ethanol     Status: None   Collection Time: 01/05/23  1:07 PM  Result Value Ref Range   Alcohol, Ethyl (B) <10 <10 mg/dL    Comment: (NOTE) Lowest detectable limit for serum alcohol is 10 mg/dL.  For medical purposes only. Performed at [facility], [address], [place], [place] [address]   Ammonia     Status: None   Collection Time: 01/05/23  1:07 PM  Result Value Ref Range   Ammonia 22 9 - 35 umol/L    Comment: Performed at [facility], [address], [place], [place] [address]  Urinalysis, Routine w reflex microscopic -Urine, Clean Catch     Status: Abnormal   Collection Time: 01/05/23  2:20 PM  Result Value Ref Range   Color, Urine YELLOW YELLOW   APPearance HAZY (A) CLEAR   Specific Gravity, Urine 1.011 1.005 - 1.030   pH 6.0 5.0 - 8.0   Glucose, UA NEGATIVE NEGATIVE mg/dL   Hgb  urine dipstick NEGATIVE NEGATIVE   Bilirubin Urine NEGATIVE NEGATIVE   Ketones, ur NEGATIVE NEGATIVE mg/dL   Protein, ur NEGATIVE NEGATIVE mg/dL   Nitrite NEGATIVE NEGATIVE   Leukocytes,Ua NEGATIVE NEGATIVE    Comment: Performed at [facility], [address] [place], [place] [address]  Urine rapid drug screen (hosp performed)     Status: Abnormal   Collection Time: 01/05/23  2:20 PM  Result Value Ref Range   Opiates NONE DETECTED NONE DETECTED   Cocaine NONE DETECTED NONE DETECTED   Benzodiazepines POSITIVE (A) NONE DETECTED   Amphetamines NONE DETECTED NONE DETECTED   Tetrahydrocannabinol NONE DETECTED NONE DETECTED   Barbiturates NONE DETECTED NONE DETECTED    Comment: (NOTE) DRUG SCREEN FOR MEDICAL PURPOSES ONLY.  IF CONFIRMATION IS NEEDED FOR ANY PURPOSE, NOTIFY LAB WITHIN 5 DAYS.  LOWEST DETECTABLE LIMITS FOR URINE DRUG SCREEN Drug Class                     Cutoff (ng/mL) Amphetamine and metabolites    1000 Barbiturate and metabolites    200 Benzodiazepine                 200 Opiates and metabolites        300 Cocaine and metabolites        300 THC                            50 Performed at [facility], [address] [place], [place] [address]   Procalcitonin     Status: None   Collection Time: 01/05/23  5:07 PM  Result Value Ref Range   Procalcitonin <0.10 ng/mL    Comment:        Interpretation: PCT (Procalcitonin) <= 0.5 ng/mL: Systemic infection (sepsis) is not likely. Local bacterial infection is possible. (NOTE)       Sepsis PCT Algorithm           Lower Respiratory Tract  Infection PCT Algorithm    ----------------------------     ----------------------------         PCT < 0.25 ng/mL                PCT < 0.10 ng/mL          Strongly encourage             Strongly discourage   discontinuation of antibiotics    initiation of antibiotics    ----------------------------     -----------------------------        PCT 0.25 - 0.50 ng/mL            PCT 0.10 - 0.25 ng/mL               OR       >80% decrease in PCT            Discourage initiation of                                            antibiotics      Encourage discontinuation           of antibiotics    ----------------------------     -----------------------------         PCT >= 0.50 ng/mL              PCT 0.26 - 0.50 ng/mL               AND        <80% decrease in PCT             Encourage initiation of                                             antibiotics       Encourage continuation           of antibiotics    ----------------------------     -----------------------------        PCT >= 0.50 ng/mL                  PCT > 0.50 ng/mL               AND         increase in PCT                  Strongly encourage                                      initiation of antibiotics    Strongly encourage escalation           of antibiotics                                     -----------------------------                                           PCT <= 0.25 ng/mL  OR                                        > 80% decrease in PCT                                      Discontinue / Do not initiate                                             antibiotics  Performed at [facility], [address], [place], [place] [address]   Valproic acid level     Status: Abnormal   Collection Time: 01/05/23  5:07 PM  Result Value Ref Range   Valproic Acid Lvl 116 (H) 50.0 - 100.0 ug/mL    Comment: Performed at [facility], [address], [place], [place] [address]  Brain natriuretic peptide     Status: None   Collection Time: 01/05/23  5:07 PM  Result Value Ref Range   B Natriuretic Peptide 41.7 0.0 - 100.0 pg/mL    Comment: Performed at [facility], [address] [place], [place] [address]  Valproic acid level     Status: None   Collection Time: 01/05/23  9:54 PM  Result Value Ref  Range   Valproic Acid Lvl 94 50.0 - 100.0 ug/mL    Comment: Performed at [facility], [address], [place], [place] [address]  HIV Antibody (routine testing w rflx)     Status: None   Collection Time: 01/05/23  9:54 PM  Result Value Ref Range   HIV Screen 4th Generation wRfx Non Reactive Non Reactive    Comment: Performed at [facility], [address], [place], [place] [address]  CBC     Status: Abnormal   Collection Time: 01/06/23  4:14 AM  Result Value Ref Range   WBC 4.8 4.0 - 10.5 K/uL   RBC 3.84 (L) 3.87 - 5.11 MIL/uL   Hemoglobin 12.4 12.0 - 15.0 g/dL   HCT 59.7 41.6 - 38.4 %   MCV 94.5 80.0 - 100.0 fL   MCH 32.3 26.0 - 34.0 pg   MCHC 34.2 30.0 - 36.0 g/dL   RDW 53.6 46.8 - 03.2 %   Platelets 92 (L) 150 - 400 K/uL    Comment: Immature Platelet Fraction may be clinically indicated, consider ordering this additional test [id number] REPEATED TO VERIFY    nRBC 0.0 0.0 - 0.2 %    Comment: Performed at [facility], [address], [place], [place] [address]  Basic metabolic panel     Status: Abnormal   Collection Time: 01/06/23  4:14 AM  Result Value Ref Range   Sodium 140 135 - 145 mmol/L   Potassium 3.4 (L) 3.5 - 5.1 mmol/L   Chloride 106 98 - 111 mmol/L   CO2 25 22 - 32 mmol/L   Glucose, Bld 72 70 - 99 mg/dL    Comment: Glucose reference range applies only to samples taken after fasting for at least 8 hours.   BUN 16 6 - 20 mg/dL   Creatinine, Ser 8.88 0.44 - 1.00 mg/dL   Calcium 8.2 (L)  8.9 - 10.3 mg/dL   GFR, Estimated >16 >10 mL/min    Comment: (NOTE) Calculated using the CKD-EPI Creatinine Equation (2021)    Anion gap 9 5 - 15    Comment: Performed at [facility], [address] [place], [place] [address]  Valproic acid level     Status: None   Collection Time: 01/06/23  4:14 AM  Result Value Ref Range   Valproic Acid Lvl 85 50.0 - 100.0 ug/mL    Comment: Performed at [facility], [address] [place], [place] [address]   Basic metabolic panel     Status: Abnormal   Collection Time: 01/07/23  7:07 AM  Result Value Ref Range   Sodium 141 135 - 145 mmol/L   Potassium 4.0 3.5 - 5.1 mmol/L   Chloride 107 98 - 111 mmol/L   CO2 24 22 - 32 mmol/L   Glucose, Bld 69 (L) 70 - 99 mg/dL    Comment: Glucose reference range applies only to samples taken after fasting for at least 8 hours.   BUN 19 6 - 20 mg/dL   Creatinine, Ser 1.91 (H) 0.44 - 1.00 mg/dL   Calcium 8.7 (L) 8.9 - 10.3 mg/dL   GFR, Estimated 60 (L) >60 mL/min    Comment: (NOTE) Calculated using the CKD-EPI Creatinine Equation (2021)    Anion gap 10 5 - 15    Comment: Performed at [name], [address] [place], [place] [address]    Current Facility-Administered Medications  Medication Dose Route Frequency Provider Last Rate Last Admin   acetaminophen (TYLENOL) tablet 650 mg  650 mg Oral Q6H PRN Clydie Braun, MD       Or   acetaminophen (TYLENOL) suppository 650 mg  650 mg Rectal Q6H PRN Madelyn Flavors A, MD       albuterol (PROVENTIL) (2.5 MG/3ML) 0.083% nebulizer solution 2.5 mg  2.5 mg Nebulization Q6H PRN Katrinka Blazing, Rondell A, MD       benztropine (COGENTIN) tablet 1 mg  1 mg Oral BID Katrinka Blazing, Rondell A, MD   1 mg at 01/07/23 0912   divalproex (DEPAKOTE) DR tablet 500 mg  500 mg Oral Q0600 Leroy Sea, MD       enoxaparin (LOVENOX) injection 40 mg  40 mg Subcutaneous Q24H Smith, Rondell A, MD   40 mg at 01/05/23 1809   fluPHENAZine (PROLIXIN) tablet 10 mg  10 mg Oral TID Madelyn Flavors A, MD   10 mg at 01/07/23 0912   LORazepam (ATIVAN) tablet 1 mg  1 mg Oral BID Madelyn Flavors A, MD   1 mg at 01/07/23 0912   ondansetron (ZOFRAN) tablet 4 mg  4 mg Oral Q6H PRN Clydie Braun, MD       Or   ondansetron (ZOFRAN) injection 4 mg  4 mg Intravenous Q6H PRN Smith, Rondell A, MD       sodium chloride flush (NS) 0.9 % injection 3 mL  3 mL Intravenous Q12H Madelyn Flavors A, MD   3 mL at 01/07/23 0918    Psychiatric Specialty  Exam:  Presentation  General Appearance:  Bizarre  Eye Contact: Fair  Speech: Clear and Coherent  Speech Volume: Normal  Handedness:No data recorded  Mood and Affect  Mood: Euthymic  Affect:Inappropriate  Thought Process  Thought Processes: Disorganized  Descriptions of Associations:Loose  Orientation:Partial  Thought Content:Delusions  History of Schizophrenia/Schizoaffective disorder:No  Duration of Psychotic Symptoms:Greater than six months  Hallucinations:Hallucinations: None  Ideas of Reference:None  Suicidal Thoughts:Suicidal Thoughts: No  Homicidal Thoughts:Homicidal Thoughts: No   Sensorium  Memory:Remote Poor Judgment:Poor Insight:Poor  Executive Functions  Concentration: Poor  Attention Span: Poor  Recall: Poor  Fund of Knowledge: Poor  Language: Good   Psychomotor Activity  Psychomotor Activity:No data recorded  Assets  Assets:No data recorded  Sleep  Sleep: Sleep: Good   Physical Exam: Physical Exam ROS Blood pressure 103/61, pulse 70, temperature 98 F (36.7 C), temperature source Axillary, resp. rate 15, height 5\' 3"  (1.6 m), SpO2 96 %. There is no height or weight on file to calculate BMI.  Treatment Plan Summary: Bipolar Disorder -Restart Depakote 500 mg PO BID -Patient has done well on Depakote 750 mg PO BID per collateral  Disposition: No evidence of imminent risk to self or others at present.   Discussed crisis plan, support from social network, calling 911, coming to the Emergency Department, and calling Suicide Hotline. Psychiatry will continue to follow and contact caregiver tomorrow in the AM to determine if patient can be discharged home.  Harlin Heys, DO 01/07/2023 12:03 PM

## 2023-01-07 NOTE — TOC Initial Note (Addendum)
Transition of Care [facility]) - Initial/Assessment Note    Patient Details  Name: [name] MRN: [id number] Date of Birth: Feb 07, 1966  [facility]) CM/SW Contact:    Jimmy Picket, LCSW Phone Number: 01/07/2023, 10:04 AM  Clinical Narrative:                  CSW spoke to Ninetta Lights, care giver and owner of [facility] assisted living. Pts legal guardian is Redmond Pulling with DSS, telephone number [phone].  Anan reports that PTA pt was independent with mobility and ADLs.   Pt plan is return to ALF by lift arranged by Ninetta Lights.   Expected Discharge Plan: Assisted Living Barriers to Discharge: Barriers Resolved   Patient Goals and CMS Choice            Expected Discharge Plan and Services       Living arrangements for the past 2 months: Assisted Living Facility Expected Discharge Date: 01/07/23                                    Prior Living Arrangements/Services Living arrangements for the past 2 months: Assisted Living Facility Lives with:: Facility Resident Patient language and need for interpreter reviewed:: Yes Do you feel safe going back to the place where you live?: Yes      Need for Family Participation in Patient Care: Yes (Comment) Care giver support system in place?: Yes (comment)   Criminal Activity/Legal Involvement Pertinent to Current Situation/Hospitalization: No - Comment as needed  Activities of Daily Living Home Assistive Devices/Equipment: None ADL Screening (condition at time of admission) Patient's cognitive ability adequate to safely complete daily activities?: No Is the patient deaf or have difficulty hearing?: No Does the patient have difficulty seeing, even when wearing glasses/contacts?: No Does the patient have difficulty concentrating, remembering, or making decisions?: Yes Patient able to express need for assistance with ADLs?: No Does the patient have difficulty dressing or bathing?:  Yes Independently performs ADLs?: No Communication: Independent Dressing (OT): Needs assistance Is this a change from baseline?: Change from baseline, expected to last <3days Grooming: Needs assistance Is this a change from baseline?: Change from baseline, expected to last >3 days Feeding: Independent Is this a change from baseline?: Pre-admission baseline Bathing: Needs assistance Is this a change from baseline?: Change from baseline, expected to last >3 days Toileting: Needs assistance Is this a change from baseline?: Change from baseline, expected to last >3days In/Out Bed: Needs assistance Is this a change from baseline?: Change from baseline, expected to last >3 days Walks in Home: Needs assistance Is this a change from baseline?: Change from baseline, expected to last >3 days Does the patient have difficulty walking or climbing stairs?: Yes Weakness of Legs: Both Weakness of Arms/Hands: None  Permission Sought/Granted Permission sought to share information with : Guardian Permission granted to share information with : Yes, Verbal Permission Granted     Permission granted to share info w AGENCY: assisted living        Emotional Assessment   Attitude/Demeanor/Rapport: Engaged Affect (typically observed): Accepting Orientation: : Oriented to Self, Oriented to Place Alcohol / Substance Use: Not Applicable Psych Involvement: No (comment)  Admission diagnosis:  Altered mental status, unspecified altered mental status type [R41.82] Acute metabolic encephalopathy [G93.41] Patient Active Problem List   Diagnosis Date Noted   Other bipolar disorder 01/06/2023   Acute metabolic encephalopathy 01/05/2023  Valproic acid toxicity 01/05/2023   Gait disturbance 01/05/2023   Elevated troponin 01/05/2023   Acute kidney injury 01/05/2023   Abnormal chest x-ray 01/05/2023   Thrombocytopenia 01/05/2023   Schizophrenia 01/05/2023   PCP:  Lupita Raider, MD Pharmacy:   [facility] [place], [place] - [address] [facility] [address] [place] [address] Phone: [phone] Fax: [phone]     Social Determinants of Health (SDOH) Social History: SDOH Screenings   Tobacco Use: High Risk (01/05/2023)   SDOH Interventions:     Readmission Risk Interventions     No data to display

## 2023-01-07 NOTE — Discharge Instructions (Signed)
Follow with Primary MD Lupita Raider, MD in 7 days, follow-up with your psychiatrist tomorrow and get Depakote dose adjusted  Get CBC, CMP, magnesium, valproic acid level,-  checked next visit with your primary MD    Activity: As tolerated with Full fall precautions use walker/cane & assistance as needed  Disposition Home    Diet: Heart Healthy    Special Instructions: If you have smoked or chewed Tobacco  in the last 2 yrs please stop smoking, stop any regular Alcohol  and or any Recreational drug use.  On your next visit with your primary care physician please Get Medicines reviewed and adjusted.  Please request your Prim.MD to go over all Hospital Tests and Procedure/Radiological results at the follow up, please get all Hospital records sent to your Prim MD by signing hospital release before you go home.  If you experience worsening of your admission symptoms, develop shortness of breath, life threatening emergency, suicidal or homicidal thoughts you must seek medical attention immediately by calling 911 or calling your MD immediately  if symptoms less severe.  You Must read complete instructions/literature along with all the possible adverse reactions/side effects for all the Medicines you take and that have been prescribed to you. Take any new Medicines after you have completely understood and accpet all the possible adverse reactions/side effects.   Do not drive when taking Pain medications.  Do not take more than prescribed Pain, Sleep and Anxiety Medications

## 2023-01-08 ENCOUNTER — Other Ambulatory Visit (HOSPITAL_COMMUNITY): Payer: Self-pay

## 2023-01-08 DIAGNOSIS — G9341 Metabolic encephalopathy: Secondary | ICD-10-CM | POA: Diagnosis not present

## 2023-01-08 LAB — VALPROIC ACID LEVEL: Valproic Acid Lvl: 49 ug/mL — ABNORMAL LOW (ref 50.0–100.0)

## 2023-01-08 MED ORDER — DIVALPROEX SODIUM 500 MG PO DR TAB
500.0000 mg | DELAYED_RELEASE_TABLET | Freq: Two times a day (BID) | ORAL | 0 refills | Status: DC
  Filled 2023-01-08: qty 60, 30d supply, fill #0

## 2023-01-08 MED ORDER — DIVALPROEX SODIUM 500 MG PO DR TAB
750.0000 mg | DELAYED_RELEASE_TABLET | Freq: Two times a day (BID) | ORAL | Status: DC
  Administered 2023-01-09: 750 mg via ORAL
  Filled 2023-01-08: qty 1

## 2023-01-08 MED ORDER — DIVALPROEX SODIUM 500 MG PO DR TAB
500.0000 mg | DELAYED_RELEASE_TABLET | Freq: Two times a day (BID) | ORAL | Status: DC
  Administered 2023-01-08 (×2): 500 mg via ORAL
  Filled 2023-01-08 (×2): qty 1

## 2023-01-08 NOTE — TOC Progression Note (Addendum)
Transition of Care [facility]) - Progression Note    Patient Details  Name: [name] MRN: [id number] Date of Birth: 1965-09-28  Transition of Care [facility]) CM/SW Contact  Erin Sons, [place] Phone Number: 01/08/2023, 10:59 AM  Clinical Narrative:     CSW called and left voicemail requesting return call for [name], pt's care taker.  CSW called Redmond Pulling pt's guardian with DSS, telephone number [phone]. Left voicemail requesting return call.   1325: CSW called [name] again; left voicemail requesting return call regarding pt's DC.    Expected Discharge Plan: Assisted Living Barriers to Discharge:  (Facility won't take pt back currently)  Expected Discharge Plan and Services       Living arrangements for the past 2 months: Assisted Living Facility Expected Discharge Date: 01/07/23                                     Social Determinants of Health (SDOH) Interventions SDOH Screenings   Tobacco Use: High Risk (01/05/2023)    Readmission Risk Interventions     No data to display

## 2023-01-08 NOTE — Progress Notes (Addendum)
PROGRESS NOTE                                                                                                                                                                                                             Patient Demographics:    [name], is a 57 y.o. female, DOB - 08/06/1966, ZOX:[id number]  Outpatient Primary MD for the patient is Lupita Raider, MD    LOS - 2  Admit date - 01/05/2023    Chief Complaint  Patient presents with   Altered Mental Status       Brief Narrative (HPI from H&P)   57 y.o. female with medical history significant of HLD, schizophrenia, and tobacco abuse who presents after being noted to be altered.  Patient currently resides in a facility and had been noted to not be herself for the last 4-5 days.  She was brought to the ER where she was found to have extremely high Depakote levels causing Depakote toxicity she was admitted for further care.   Subjective:   Seen in bed she knows she is in the hospital, states does not want to go back to her previous facility   Assessment  & Plan :   She is medically stable await safe disposition.   History of schizophrenia with Depakote toxicity.  Depakote has been held, has been adequately hydrated and levels are now within therapeutic range seen by psychiatry, Depakote dose adjusted as recommended by psych, she is able to ambulate without much assistance or walker use with the CNA on 01/07/2023.  However per supervisor at the group home patient's mentation is not back at her baseline, she will continue to monitor her and take her back when she thinks it stable.  Of note her Depakote dose was increased a month ago by her psychiatrist I am wondering if she was having some difficulty to control her schizophrenia symptoms even before coming to the hospital prompting the recent increase in Depakote dosing.     Nonspecific chest x-ray changes.  No  cough, no fever or leukocytosis, stable procalcitonin.  Monitor.  No antibiotics needed.  Dehydration with AKI.  Resolved after IV fluids.  Minimal elevation of troponin.  Trend flat non-ACS pattern, no chest pain no EKG changes.  No further workup needed.  Hypokalemia.  Replaced.      Condition -  Fair  Family Communication  :   Called father [name] [phone] on 01/06/2023 at 8:03 AM, message left  Amam [phone] 01/06/2023 at 8:04 AM.  Message left, called and discussed in detail on 01/07/2023   Code Status :  Full  Consults  :  Psych  PUD Prophylaxis :    Procedures  :     CT head.  Nonacute, probably anatomical variant of empty sella syndrome      Disposition Plan  :    Status is: Observation  DVT Prophylaxis  :    enoxaparin (LOVENOX) injection 40 mg Start: 01/05/23 1800     Lab Results  Component Value Date   PLT 92 (L) 01/06/2023    Diet :  Diet Order             Diet - low sodium heart healthy           Diet Heart Room service appropriate? Yes; Fluid consistency: Thin  Diet effective now                    Inpatient Medications  Scheduled Meds:  benztropine  1 mg Oral BID   divalproex  500 mg Oral Q12H   enoxaparin (LOVENOX) injection  40 mg Subcutaneous Q24H   fluPHENAZine  10 mg Oral TID   LORazepam  1 mg Oral BID   sodium chloride flush  3 mL Intravenous Q12H   Continuous Infusions:   PRN Meds:.acetaminophen **OR** acetaminophen, albuterol, ondansetron **OR** ondansetron (ZOFRAN) IV  Antibiotics  :    Anti-infectives (From admission, onward)    Start     Dose/Rate Route Frequency Ordered Stop   01/05/23 1445  cefTRIAXone (ROCEPHIN) 1 g in sodium chloride 0.9 % 100 mL IVPB        1 g 200 mL/hr over 30 Minutes Intravenous  Once 01/05/23 1435 01/05/23 1848   01/05/23 1445  azithromycin (ZITHROMAX) 500 mg in sodium chloride 0.9 % 250 mL IVPB        500 mg 250 mL/hr over 60 Minutes Intravenous  Once 01/05/23 1435 01/05/23  1730         Objective:   Vitals:   01/07/23 0804 01/07/23 1314 01/07/23 2015 01/08/23 0207  BP:    113/86  Pulse:   75 80  Resp:  Temp: 98 F (36.7 C) 97.8 F (36.6 C) 97.9 F (36.6 C) 98.2 F (36.8 C)  TempSrc: Axillary Axillary Axillary Axillary  SpO2:   96% 98%  Height:        Wt Readings from Last 3 Encounters:  03/10/12 89.8 kg     Intake/Output Summary (Last 24 hours) at 01/08/2023 0935 Last data filed at 01/07/2023 1936 Gross per 24 hour  Intake 500 ml  Output --  Net 500 ml     Physical Exam  Awake, still delusional, no focal deficits End[place].AT,PERRAL Supple Neck, No JVD,   Symmetrical Chest wall movement, Good air movement bilaterally, CTAB RRR,No Gallops,Rubs or new Murmurs,  +ve B.Sounds, Abd Soft, No tenderness,   No Cyanosis, Clubbing or edema         Data Review:    Recent Labs  Lab 01/05/23 1104 01/06/23 0414  WBC 5.7 4.8  HGB 14.0 12.4  HCT 44.0 36.3  PLT 91* 92*  MCV 99.8 94.5  MCH 31.7 32.3  MCHC 31.8 34.2  RDW 13.1 13.2  LYMPHSABS 1.4  --   MONOABS 0.7  --   EOSABS 0.0  --  BASOSABS 0.0  --     Recent Labs  Lab 01/05/23 1104 01/05/23 1307 01/05/23 1707 01/06/23 0414 01/07/23 0707  NA 140  --   --  140 141  K 4.1  --   --  3.4* 4.0  CL 108  --   --  106 107  CO2 23  --   --  25 24  ANIONGAP 9  --   --  9 10  GLUCOSE 79  --   --  72 69*  BUN 16  --   --  16 19  CREATININE 1.14*  --   --  1.00 1.08*  AST 24  --   --   --   --   ALT 15  --   --   --   --   ALKPHOS 50  --   --   --   --   BILITOT 0.7  --   --   --   --   ALBUMIN 3.2*  --   --   --   --   PROCALCITON  --   --  <0.10  --   --   AMMONIA  --  22  --   --   --   BNP  --   --  41.7  --   --   CALCIUM 8.8*  --   --  8.2* 8.7*     Radiology Reports CT Head Wo Contrast  Result Date: 01/05/2023 CLINICAL DATA:  Mental status change, unknown cause EXAM: CT HEAD WITHOUT CONTRAST TECHNIQUE: Contiguous axial images were obtained from the base of  the skull through the vertex without intravenous contrast. RADIATION DOSE REDUCTION: This exam was performed according to the departmental dose-optimization program which includes automated exposure control, adjustment of the mA and/or kV according to patient size and/or use of iterative reconstruction technique. COMPARISON:  CT head November 05, 2003. FINDINGS: Brain: No evidence of acute infarction, hemorrhage, hydrocephalus, extra-axial collection or mass lesion/mass effect. Patchy white matter hypodensities, compatible with chronic microvascular ischemic disease. Cerebral atrophy. Partially empty sella. Vascular: No hyperdense vessel. Skull: No acute fracture. Sinuses/Orbits: Largely clear sinuses.  No acute orbital findings. Other: No mastoid effusions IMPRESSION: 1. No evidence of acute intracranial abnormality. 2. Partially empty sella, which is often a normal anatomic variant but can be associated with idiopathic intracranial hypertension (pseudotumor cerebri). Electronically Signed   By: Feliberto Harts M.D.   On: 01/05/2023 12:00   DG Chest 2 View  Result Date: 01/05/2023 CLINICAL DATA:  Altered mental status. EXAM: CHEST - 2 VIEW COMPARISON:  None Available. FINDINGS: Normal cardiomediastinal contours. Low lung volumes. Mild increase interstitial markings noted bilaterally. No airspace consolidation or pleural effusion. The visualized osseous structures are unremarkable. IMPRESSION: 1. Low lung volumes. 2. Mild increase interstitial markings may reflect early edema or atypical infection. Electronically Signed   By: Signa Kell M.D.   On: 01/05/2023 11:31      Signature  -   Susa Raring M.D on 01/08/2023 at 9:35 AM   -  To page go to [email or web address]

## 2023-01-08 NOTE — TOC Progression Note (Signed)
Transition of Care [facility]) - Progression Note    Patient Details  Name: [name] MRN: [id number] Date of Birth: 1966-07-04  Transition of Care [facility]) CM/SW Contact  Gordy Clement, RN Phone Number: 01/08/2023, 4:41 PM  Clinical Narrative:    Home Health arranged for this patient through [facility]. PT and OT. Liaison has acknowledged and accepted. Rolling walker ordered and will be delivered to bedside prior to DC by [employer] . Patient will be dcing to and ALF   TOC will continue to follow patient for any additional discharge needs      Expected Discharge Plan: Assisted Living Barriers to Discharge:  (Facility won't take pt back currently)  Expected Discharge Plan and Services       Living arrangements for the past 2 months: Assisted Living Facility Expected Discharge Date: 01/07/23                                     Social Determinants of Health (SDOH) Interventions SDOH Screenings   Tobacco Use: High Risk (01/05/2023)    Readmission Risk Interventions     No data to display

## 2023-01-08 NOTE — TOC Progression Note (Addendum)
Transition of Care [facility]) - Progression Note    Patient Details  Name: [name] MRN: [id number] Date of Birth: 1965/11/24  Transition of Care [facility]) CM/SW Contact  Erin Sons, [place] Phone Number: 01/08/2023, 2:28 PM  Clinical Narrative:     CSW received call back from Ninetta Lights, pt's caretaker. Discussed pt's discharge. Caretaker states when she visited pt today, staff member told her pt was not ready for DC. CSW explained pt is medically stable for DC and that orders and summary have been in. Caretaker states that they won't be able to pick pt up until 9am tomorrow. Pt will have an outpatient follow up appointment with her psychiatrist tomorrow. Caretaker is requesting pt receives a depakote dose at 8am tomorrow. She requests records be faxed to [phone]. CSW faxed Fl2, DC summary, and PT note.   1505: CSW left voicemail with pt's caretaker to confirm she received faxed documents and to inquire if TOC can arrange [facility] and/or DME(walker) for pt.   1625: Spoke with caretaker and discussed corrections needed on fl2. Corrections made and signed copies faxed. HH orders and Face to face faxed as well.They would like HH arranged with [facility] and would like pt to have walker delivered to room. CSW notified RNCM. ALF will provide transportation at DC tomorrow morning at 9am.   Expected Discharge Plan: Assisted Living Barriers to Discharge:  (Facility won't take pt back currently)  Expected Discharge Plan and Services       Living arrangements for the past 2 months: Assisted Living Facility Expected Discharge Date: 01/07/23                                     Social Determinants of Health (SDOH) Interventions SDOH Screenings   Tobacco Use: High Risk (01/05/2023)    Readmission Risk Interventions     No data to display

## 2023-01-08 NOTE — Progress Notes (Signed)
Occupational Therapy Treatment Patient Details Name: [name] MRN: [id number] DOB: 07-10-1966 Today's Date: 01/08/2023   History of present illness Pt is a 57 y.o. female who presented 01/05/23 with AMS, frequent falls, and slurred speech. Imagining with no evidence of acute intracranial abnormality. Admitted with acute metabolic encephalopathy and gait disturbance secondary to Depakote toxicity. PMH: HLD, schizophrenia, and tobacco abuse   OT comments  Pt previously living in group home and was completing ADLs Independent/Mod I. Pt has a hx of delusion. However, pt is not at baseline mentation and currently presents with decreased orientation, awareness, sequencing, safety awareness, problem solving, memory, and attention to task. Pt currently requiring Supervision to Min guard assist with constant cuing for safety, sequencing, and attention to task. During this session, OT instructed pt in techniques for increased safety and independence with UB dressing, grooming standing at sink, LB bathing standing at sink (pt unable to redirect to EOB for increased safety), toileting tranfers, toielting hygiene and clothing management, functional mobility, and bed mobility all with continuous cues for safety, sequencing, and attention to task. OT goals added this session. Pt was agreeable to participation in OT session and is progressing toward goals. Pt will benefit from continued acute skilled OT services to increase safety and independence with ADLs and increase ability to return to prior level of living.    Recommendations for follow up therapy are one component of a multi-disciplinary discharge planning process, led by the attending physician.  Recommendations may be updated based on patient status, additional functional criteria and insurance authorization.    Assistance Recommended at Discharge Frequent or constant Supervision/Assistance  Patient can return home with the following  A little help with  walking and/or transfers;A little help with bathing/dressing/bathroom;Assistance with cooking/housework;Direct supervision/assist for medications management;Direct supervision/assist for financial management;Assist for transportation;Help with stairs or ramp for entrance   Equipment Recommendations       Recommendations for Other Services      Precautions / Restrictions Precautions Precautions: Fall Restrictions Weight Bearing Restrictions: No       Mobility Bed Mobility Overal bed mobility: Needs Assistance Bed Mobility: Supine to Sit, Sit to Supine     Supine to sit: Supervision, HOB elevated Sit to supine: Supervision, HOB elevated        Transfers Overall transfer level: Needs assistance Equipment used: 1 person hand held assist (Pt refused use of RW (2 wheel) during this session.) Transfers: Sit to/from Stand, Bed to chair/wheelchair/BSC Sit to Stand: Min guard     Step pivot transfers: Min guard     General transfer comment: Pt requires constant cues for safety, sequencing, and attention to task.     Balance Overall balance assessment: Needs assistance Sitting-balance support: No upper extremity supported, Feet supported Sitting balance-Leahy Scale: Fair Sitting balance - Comments: Able to don/doff socks with Supervision and max cues sitting   Standing balance support: No upper extremity supported, During functional activity Standing balance-Leahy Scale: Fair                             ADL either performed or assessed with clinical judgement   ADL Overall ADL's : Needs assistance/impaired Eating/Feeding: Set up;Sitting   Grooming: Oral care;Wash/dry face;Supervision/safety;Cueing for safety;Cueing for sequencing;Standing       Lower Body Bathing: Cueing for sequencing;Cueing for safety;Min guard (Standing at sink; Pt spontaniously began washing genital area/buttocks standing at sink after washing face. OT attempted to redirect pt back to  EOB to complete UB/LB bathing sit/stand for increased safety. However, pt was not able to redirect.)   Upper Body Dressing : Min guard;Sitting;Cueing for sequencing       Toilet Transfer: Min guard   Toileting- Clothing Manipulation and Hygiene: Min guard              Extremity/Trunk Assessment Upper Extremity Assessment Upper Extremity Assessment: Overall WFL for tasks assessed   Lower Extremity Assessment Lower Extremity Assessment: Defer to PT evaluation   Cervical / Trunk Assessment Cervical / Trunk Assessment: Normal    Vision Patient Visual Report: Other (comment) (Pt unable to report. Vision appears [facility] during functional tasks.)     Perception     Praxis      Cognition Arousal/Alertness: Awake/alert Behavior During Therapy: Flat affect Overall Cognitive Status: Impaired/Different from baseline (Per chart review, pt has a history of cognitive impairments but is not currently at baseline mentation. No family/caregiver present to confirm.) Area of Impairment: Orientation, Attention, Following commands, Safety/judgement, Awareness, Problem solving, Memory                 Orientation Level: Disoriented to, Place, Time, Situation Current Attention Level: Focused Memory: Decreased short-term memory Following Commands: Follows one step commands inconsistently, Follows one step commands with increased time Safety/Judgement: Decreased awareness of safety, Decreased awareness of deficits Awareness: Intellectual Problem Solving: Slow processing, Decreased initiation, Difficulty sequencing, Requires verbal cues General Comments: Pt with a hx of delusions. However, per chart review pt is not at baseline mentaiton. Per chart review, pt was previously Independent to Mod I with ADLs. Currently pt presents with decreased safety awreness, decreased attention to task, disorientation, decreased ability to follow 1 step directions, decreased sequencing, and decreased insight  into deficits sufficient to require assistance with ADLs.        Exercises      Shoulder Instructions       General Comments Pt requires constant cues to maintain attention to task, safety, and sequencing duing functional tasks, functional transfers, and functional mobility.    Pertinent Vitals/ Pain       Pain Assessment Pain Assessment: No/denies pain Faces Pain Scale: No hurt  Home Living Family/patient expects to be discharged to:: Other (Comment) (Pt previously lived in group home. Group home owner/supervisor has expressed concern regarding pt returning to group home if pt is not at baseline mentation and PLOF. Physician and social worker aware and in communication with group home owner.)                                        Prior Functioning/Environment              Frequency  Min 2X/week        Progress Toward Goals  OT Goals(current goals can now be found in the care plan section)  Progress towards OT goals: Progressing toward goals (Goals added this session)  Acute Rehab OT Goals Patient Stated Goal: Pt unable to state. OT Goal Formulation: Patient unable to participate in goal setting ADL Goals Pt Will Perform Grooming: with set-up;standing (for 2 activities with no cues needed for sequencing or maintaining attention to task) Pt Will Transfer to Toilet: with modified independence;ambulating (with no cues needed for sequencing or maintaining attention to task) Pt Will Perform Toileting - Clothing Manipulation and hygiene: with supervision;sit to/from stand (with no cues needed for sequencing or attention to  task) Additional ADL Goal #1: Pt will participate in 10 minutes of therapeutic activity sitting with no cues needed for sequencing or attention to task to increase safety and independence with functional tasks and improve pt ability to return to prior level of living.  Plan Other (comment);Discharge plan remains appropriate     Co-evaluation                 AM-PAC OT "6 Clicks" Daily Activity     Outcome Measure   Help from another person eating meals?: A Little Help from another person taking care of personal grooming?: A Little Help from another person toileting, which includes using toliet, bedpan, or urinal?: A Little Help from another person bathing (including washing, rinsing, drying)?: A Little Help from another person to put on and taking off regular upper body clothing?: A Little Help from another person to put on and taking off regular lower body clothing?: A Little 6 Click Score: 18    End of Session Equipment Utilized During Treatment: Gait belt  OT Visit Diagnosis: Unsteadiness on feet (R26.81);History of falling (Z91.81);Repeated falls (R29.6);Other symptoms and signs involving cognitive function;Other abnormalities of gait and mobility (R26.89)   Activity Tolerance Patient tolerated treatment well   Patient Left in bed;with call bell/phone within reach;with bed alarm set   Nurse Communication Mobility status        Time: 0981-1914 OT Time Calculation (min): 37 min  Charges: OT Treatments $Self Care/Home Management : 23-37 mins  Sheccid Lahmann "Orson Eva., OTR/L, MA Acute Rehab [phone]   Lendon Colonel 01/08/2023, 11:38 AM

## 2023-01-08 NOTE — NC FL2 (Addendum)
  [place] MEDICAID FL2 LEVEL OF CARE FORM     IDENTIFICATION  Patient Name: [name] Birthdate: 1966/06/24 Sex: female Admission Date (Current Location): 01/05/2023  [facility] and [place] Number:  Producer, television/film/video and Address:  [facility], [address], [place], [place] [address]      Provider Number: [id number]  Attending Physician Name and Address:  Leroy Sea, MD  Relative Name and Phone Number:       Current Level of Care: Hospital Recommended Level of Care: Assisted Living Facility Prior Approval Number:    Date Approved/Denied:   PASRR Number:    Discharge Plan: Other (Comment) (assisted living facility)    Current Diagnoses: Patient Active Problem List   Diagnosis Date Noted   Other bipolar disorder 01/06/2023   Acute metabolic encephalopathy 01/05/2023   Valproic acid toxicity 01/05/2023   Gait disturbance 01/05/2023   Elevated troponin 01/05/2023   Acute kidney injury 01/05/2023   Abnormal chest x-ray 01/05/2023   Thrombocytopenia 01/05/2023   Schizophrenia 01/05/2023    Orientation RESPIRATION BLADDER Height & Weight     Self  Normal Continent Weight:   Height:  5\' 3"  (160 cm)  BEHAVIORAL SYMPTOMS/MOOD NEUROLOGICAL BOWEL NUTRITION STATUS      Continent Low fat, low cholesterol   AMBULATORY STATUS COMMUNICATION OF NEEDS Skin   Supervision Verbally Normal                       Personal Care Assistance Level of Assistance  Bathing, Feeding, Dressing Bathing Assistance: Limited assistance Feeding assistance: Independent Dressing Assistance: Independent     Functional Limitations Info  Sight, Hearing, Speech Sight Info: Adequate Hearing Info: Adequate Speech Info: Adequate    SPECIAL CARE FACTORS FREQUENCY  PT (By licensed PT), OT (By licensed OT)     PT Frequency: 2-3x/week OT Frequency: 2-3x/week            Contractures Contractures Info: Not present    Additional Factors Info   Code Status, Allergies Code Status Info: Full code Allergies Info: Abilify (aripiprazole), Ativan (lorazepam), Atorvastatin, Clozaril (clozapine), Geodon (ziprasidone Hcl), Seroquel (quetiapine Fumarate)             Discharge Medications: Medication List       STOP taking these medications     divalproex 500 MG 24 hr tablet Commonly known as: DEPAKOTE ER    neomycin-polymyxin-hydrocortisone OTIC solution Commonly known as: CORTISPORIN           TAKE these medications     acetaminophen 325 MG tablet Commonly known as: TYLENOL Take 650 mg by mouth every 6 (six) hours as needed for mild pain, moderate pain, fever or headache.    benzonatate 100 MG capsule Commonly known as: TESSALON Take 100 mg by mouth 3 (three) times daily as needed for cough.    benztropine 1 MG tablet Commonly known as: COGENTIN Take 1 mg by mouth 2 (two) times daily.    fluPHENAZine 10 MG tablet Commonly known as: PROLIXIN Take 10 mg by mouth 3 (three) times daily.    LORazepam 1 MG tablet Commonly known as: ATIVAN Take 1 mg by mouth 2 (two) times daily.    Vitamin D 50 MCG (2000 UT) Caps Take 2,000 Units by mouth daily.    Relevant Imaging Results:  Relevant Lab Results:   Additional Information SSN [id number] [place], [place]

## 2023-01-08 NOTE — Progress Notes (Signed)
Physical Therapy Treatment Patient Details Name: [name] MRN: [id number] DOB: [birth year] Today's Date: 01/08/2023   History of Present Illness Pt is a 57 y.o. female who presented 01/05/23 with AMS, frequent falls, and slurred speech. Imagining with no evidence of acute intracranial abnormality. Admitted with acute metabolic encephalopathy and gait disturbance secondary to Depakote toxicity. PMH: HLD, schizophrenia, and tobacco abuse    PT Comments    Pt was seen for mobility on RW initially but declines to use it.  Her gait is mildly unsteady and initially used min guard assist, then down to supervision as pt seems more confident with the task.  Pt is requested to get a RW but it is not clear if she is going to use it.  Would benefit if she is willing to hold the walker once back to ALF.  Recommending HHPT as well to encourage and challenge her balance skills.  Follow acutely for goals of PT as outlined on POC.  Recommendations for follow up therapy are one component of a multi-disciplinary discharge planning process, led by the attending physician.  Recommendations may be updated based on patient status, additional functional criteria and insurance authorization.  Follow Up Recommendations       Assistance Recommended at Discharge Frequent or constant Supervision/Assistance  Patient can return home with the following A little help with walking and/or transfers;A lot of help with bathing/dressing/bathroom;Assistance with cooking/housework;Direct supervision/assist for medications management;Direct supervision/assist for financial management;Assist for transportation;Help with stairs or ramp for entrance   Equipment Recommendations  Rolling walker (2 wheels);BSC/3in1    Recommendations for Other Services       Precautions / Restrictions Precautions Precautions: Fall Restrictions Weight Bearing Restrictions: No     Mobility  Bed Mobility Overal bed mobility: Needs  Assistance Bed Mobility: Supine to Sit     Supine to sit: Supervision     General bed mobility comments: pt takes a bit of extra time but can complete the task    Transfers Overall transfer level: Needs assistance Equipment used: 1 person hand held assist   Sit to Stand: Min guard           General transfer comment: min guard with reminders about sequence    Ambulation/Gait Ambulation/Gait assistance: Supervision, Min guard Gait Distance (Feet): 80 Feet Assistive device: None Gait Pattern/deviations: Step-through pattern, Decreased stride length, Knee flexed in stance - left, Knee flexed in stance - right, Shuffle, Trunk flexed Gait velocity: reduced Gait velocity interpretation: <1.31 ft/sec, indicative of household ambulator Pre-gait activities: standing balance ck General Gait Details: pt is walking without AD but refuses the walker in the room even encouraged to use it.  Has shuffling pattern but can stop and turn with vc's and self limiting distance   Stairs             Wheelchair Mobility    Modified Rankin (Stroke Patients Only)       Balance Overall balance assessment: Needs assistance Sitting-balance support: Feet supported Sitting balance-Leahy Scale: Good     Standing balance support: No upper extremity supported Standing balance-Leahy Scale: Fair Standing balance comment: min guard to walk then could release her UE and pt finished walk without AD, seems more related to awareness than her actual physical needs                            Cognition Arousal/Alertness: Awake/alert Behavior During Therapy: Flat affect Overall Cognitive Status: Impaired/Different from baseline  Area of Impairment: Safety/judgement, Attention                   Current Attention Level: Selective     Safety/Judgement: Decreased awareness of safety, Decreased awareness of deficits   Problem Solving: Slow processing, Requires verbal  cues General Comments: Pt is speaking about her abilities to move and dismisses the use of RW, feels she is doing well        Exercises      General Comments General comments (skin integrity, edema, etc.): reminders for direction and to reassure her that there is not an issue to walk toward the fire door, perceives this as a personal threat      Pertinent Vitals/Pain Pain Assessment Pain Assessment: No/denies pain    Home Living Family/patient expects to be discharged to:: Other (Comment) (Pt previously lived in group home. Group home owner/supervisor has expressed concern regarding pt returning to group home if pt is not at baseline mentation and PLOF. Physician and social worker aware and in communication with group home owner.)                        Prior Function            PT Goals (current goals can now be found in the care plan section) Acute Rehab PT Goals Patient Stated Goal: to get home soon Progress towards PT goals: Progressing toward goals    Frequency    Min 3X/week      PT Plan Current plan remains appropriate    Co-evaluation              AM-PAC PT "6 Clicks" Mobility   Outcome Measure  Help needed turning from your back to your side while in a flat bed without using bedrails?: A Little Help needed moving from lying on your back to sitting on the side of a flat bed without using bedrails?: A Little Help needed moving to and from a bed to a chair (including a wheelchair)?: A Little Help needed standing up from a chair using your arms (e.g., wheelchair or bedside chair)?: A Little Help needed to walk in hospital room?: A Little Help needed climbing 3-5 steps with a railing? : A Lot 6 Click Score: 17    End of Session Equipment Utilized During Treatment: Gait belt Activity Tolerance: Patient tolerated treatment well Patient left: in chair;with call bell/phone within reach;with chair alarm set Nurse Communication: Mobility status PT  Visit Diagnosis: Unsteadiness on feet (R26.81);Other abnormalities of gait and mobility (R26.89);Muscle weakness (generalized) (M62.81);History of falling (Z91.81);Repeated falls (R29.6);Difficulty in walking, not elsewhere classified (R26.2)     Time: 9201-0071 PT Time Calculation (min) (ACUTE ONLY): 22 min  Charges:  $Gait Training: 8-22 mins       Ivar Drape 01/08/2023, 12:37 PM  Samul Dada, PT PhD Acute Rehab Dept. Number: [facility] [id number] and [facility] [phone]

## 2023-01-08 NOTE — Consult Note (Signed)
  Psychiatric consult service followed up with previous recommendations from psychiatrist to contact caregiver.  Writer was able to successfully contact caregiver to address or identify any new barriers, questions, and or concerns regarding discharge.  Writer did suggest keeping Depakote at 750 mg p.o. twice daily.  Further recommendations include not increasing difficult past 1500 mg daily total, and she seems very sensitive to higher doses.  Writer did inquire about any additional comments, questions, and or concerns.  In which [name] declined, caregiver did seem to Media planner about difficult adjustments throughout hospitalization; citing she has received conflicting information.  There does appear to be a new change of Depakote adjustment today to 500 mg p.o. twice daily.  Last Depakote level obtained on January 06, 2023.  After speaking with social work, group home will be available to pick up patient tomorrow.  Will repeat Depakote level at this time. Patient initially admitted for Depakote toxicity, levels have returned to therapeutic range.  Valproic acid medication has been resumed at this time.  Patient was admitted for observation and supportive management.  We do not believe this toxicity was intentional or due to an overdose.  Patient's Depakote level was adjusted by outpatient psychiatrist.  Patient is currently linked with appropriate services and outpatient management, no further adjustments will be made at this time.   -Will hold evening dose, pending Depakote level this afternoon.  May resume Depakote following level obtained.  Have contacted pharmacy about holding afternoon dose until level is drawn.  Will make sure levels are in appropriate range prior to discharge and patient to prevent and reduce any additional barriers to discharging home. -Continue current dose of Depakote 500 mg p.o. every 12 hours.  If level is subtherapeutic, will increase Depakote 750 mg p.o. twice  daily. Psychiatric consult service to sign off.

## 2023-01-09 ENCOUNTER — Other Ambulatory Visit (HOSPITAL_COMMUNITY): Payer: Self-pay

## 2023-01-09 MED ORDER — DIVALPROEX SODIUM 250 MG PO DR TAB
750.0000 mg | DELAYED_RELEASE_TABLET | Freq: Two times a day (BID) | ORAL | 0 refills | Status: AC
  Filled 2023-01-09: qty 180, 30d supply, fill #0

## 2023-01-09 NOTE — Plan of Care (Signed)
  Problem: Clinical Measurements: Goal: Will remain free from infection Outcome: Progressing   Problem: Activity: Goal: Risk for activity intolerance will decrease Outcome: Progressing   Problem: Nutrition: Goal: Adequate nutrition will be maintained Outcome: Progressing   Problem: Coping: Goal: Level of anxiety will decrease Outcome: Progressing   Problem: Elimination: Goal: Will not experience complications related to bowel motility Outcome: Progressing Goal: Will not experience complications related to urinary retention Outcome: Progressing   Problem: Pain Managment: Goal: General experience of comfort will improve Outcome: Progressing   Problem: Safety: Goal: Ability to remain free from injury will improve Outcome: Progressing   Problem: Skin Integrity: Goal: Risk for impaired skin integrity will decrease Outcome: Progressing   

## 2023-01-09 NOTE — Progress Notes (Signed)
Mobility Specialist - Progress Note   01/09/23 1059  Mobility  Activity Ambulated with assistance in hallway  Level of Assistance Contact guard assist, steadying assist  Assistive Device Other (Comment) (HHA)  Distance Ambulated (ft) 50 ft  Activity Response Tolerated well  Mobility Referral Yes  $Mobility charge 1 Mobility   Pt received in chair and agreeable to mobility. No complaints throughout session. Pt was returned to chair and changed for discharge. RN present.   Alda Lea  Mobility Specialist Please contact via Special educational needs teacher or Rehab office at [phone]

## 2023-01-09 NOTE — Plan of Care (Signed)
Discharged

## 2023-01-09 NOTE — Progress Notes (Signed)
1 script filled by [facility] delivered and placed in pt's d/c envelope for the pt and ALF she is returning to. Pt being discharged via wheelchair with belongings, escorted by unit staff.

## 2023-01-09 NOTE — TOC Transition Note (Addendum)
Transition of Care [facility]) - CM/SW Discharge Note   Patient Details  Name: [name] MRN: [id number] Date of Birth: January 20, 1966  [facility] CM/SW Contact:  Mearl Latin, LCSW Phone Number: 01/09/2023, 9:57 AM   Clinical Narrative:    CSW left voicemail for Redmond Pulling pt's guardian with DSS [phone]) to make her aware of discharge. CSW confirmed walker was delivered to patient's room and home health requested.   CSW spoke with Synthia Innocent at the ALF and she stated she thought she was going to get a call regarding pick up time for patient. CSW made her aware that the notes state facility would arrive for pickup at 9am and did not indicate a call was needed. She stated she will see if the facility can pick patient up versus contacting Uber/Ly[employer] for transport.    Final next level of care: Assisted Living Barriers to Discharge: Barriers Resolved   Patient Goals and CMS Choice CMS Medicare.gov Compare Post Acute Care list provided to:: Legal Guardian Choice offered to / list presented to : [facility] POA / Guardian  Discharge Placement                Patient chooses bed at: Other - please specify in the comment section below: ([facility]) Patient to be transferred to facility by: Car arranged by Ninetta Lights Name of family member notified: Ninetta Lights, caregiver, and message left with Redmond Pulling DSS guardiant Patient and family notified of of transfer: 01/09/23  Discharge Plan and Services Additional resources added to the After Visit Summary for   In-house Referral: Clinical Social Work   Post Acute Care Choice: Home Health, Durable Medical Equipment                               Social Determinants of Health (SDOH) Interventions SDOH Screenings   Tobacco Use: High Risk (01/05/2023)     Readmission Risk Interventions     No data to display

## 2023-01-09 NOTE — Progress Notes (Signed)
Patient is discharged back to the assisted living facility. Her walker was delivered to take with her.  She is calm and cooperative.

## 2023-01-10 DIAGNOSIS — F17210 Nicotine dependence, cigarettes, uncomplicated: Secondary | ICD-10-CM | POA: Diagnosis not present

## 2023-01-10 DIAGNOSIS — Z9181 History of falling: Secondary | ICD-10-CM | POA: Diagnosis not present

## 2023-01-10 DIAGNOSIS — E876 Hypokalemia: Secondary | ICD-10-CM | POA: Diagnosis not present

## 2023-01-10 DIAGNOSIS — D696 Thrombocytopenia, unspecified: Secondary | ICD-10-CM | POA: Diagnosis not present

## 2023-01-10 DIAGNOSIS — E7800 Pure hypercholesterolemia, unspecified: Secondary | ICD-10-CM | POA: Diagnosis not present

## 2023-01-10 DIAGNOSIS — G9341 Metabolic encephalopathy: Secondary | ICD-10-CM | POA: Diagnosis not present

## 2023-01-19 DIAGNOSIS — Z9181 History of falling: Secondary | ICD-10-CM | POA: Diagnosis not present

## 2023-01-19 DIAGNOSIS — F17210 Nicotine dependence, cigarettes, uncomplicated: Secondary | ICD-10-CM | POA: Diagnosis not present

## 2023-01-19 DIAGNOSIS — E876 Hypokalemia: Secondary | ICD-10-CM | POA: Diagnosis not present

## 2023-01-19 DIAGNOSIS — D696 Thrombocytopenia, unspecified: Secondary | ICD-10-CM | POA: Diagnosis not present

## 2023-01-19 DIAGNOSIS — E7800 Pure hypercholesterolemia, unspecified: Secondary | ICD-10-CM | POA: Diagnosis not present

## 2023-01-19 DIAGNOSIS — G9341 Metabolic encephalopathy: Secondary | ICD-10-CM | POA: Diagnosis not present

## 2023-01-22 DIAGNOSIS — Z720 Tobacco use: Secondary | ICD-10-CM | POA: Diagnosis not present

## 2023-01-22 DIAGNOSIS — N179 Acute kidney failure, unspecified: Secondary | ICD-10-CM | POA: Diagnosis not present

## 2023-01-22 DIAGNOSIS — T43501S Poisoning by unspecified antipsychotics and neuroleptics, accidental (unintentional), sequela: Secondary | ICD-10-CM | POA: Diagnosis not present

## 2023-01-22 DIAGNOSIS — E876 Hypokalemia: Secondary | ICD-10-CM | POA: Diagnosis not present

## 2023-01-22 DIAGNOSIS — D696 Thrombocytopenia, unspecified: Secondary | ICD-10-CM | POA: Diagnosis not present

## 2023-01-25 DIAGNOSIS — Z9181 History of falling: Secondary | ICD-10-CM | POA: Diagnosis not present

## 2023-01-25 DIAGNOSIS — E876 Hypokalemia: Secondary | ICD-10-CM | POA: Diagnosis not present

## 2023-01-25 DIAGNOSIS — G9341 Metabolic encephalopathy: Secondary | ICD-10-CM | POA: Diagnosis not present

## 2023-01-25 DIAGNOSIS — E7800 Pure hypercholesterolemia, unspecified: Secondary | ICD-10-CM | POA: Diagnosis not present

## 2023-01-25 DIAGNOSIS — F17210 Nicotine dependence, cigarettes, uncomplicated: Secondary | ICD-10-CM | POA: Diagnosis not present

## 2023-01-25 DIAGNOSIS — D696 Thrombocytopenia, unspecified: Secondary | ICD-10-CM | POA: Diagnosis not present

## 2023-01-29 DIAGNOSIS — E7800 Pure hypercholesterolemia, unspecified: Secondary | ICD-10-CM | POA: Diagnosis not present

## 2023-01-29 DIAGNOSIS — Z9181 History of falling: Secondary | ICD-10-CM | POA: Diagnosis not present

## 2023-01-29 DIAGNOSIS — D696 Thrombocytopenia, unspecified: Secondary | ICD-10-CM | POA: Diagnosis not present

## 2023-01-29 DIAGNOSIS — E876 Hypokalemia: Secondary | ICD-10-CM | POA: Diagnosis not present

## 2023-01-29 DIAGNOSIS — F17210 Nicotine dependence, cigarettes, uncomplicated: Secondary | ICD-10-CM | POA: Diagnosis not present

## 2023-01-29 DIAGNOSIS — G9341 Metabolic encephalopathy: Secondary | ICD-10-CM | POA: Diagnosis not present

## 2023-02-21 DIAGNOSIS — M545 Low back pain, unspecified: Secondary | ICD-10-CM | POA: Diagnosis not present

## 2023-05-10 DIAGNOSIS — R2689 Other abnormalities of gait and mobility: Secondary | ICD-10-CM | POA: Diagnosis not present

## 2023-05-10 DIAGNOSIS — R5383 Other fatigue: Secondary | ICD-10-CM | POA: Diagnosis not present

## 2023-05-10 DIAGNOSIS — T148XXA Other injury of unspecified body region, initial encounter: Secondary | ICD-10-CM | POA: Diagnosis not present

## 2023-05-10 DIAGNOSIS — Z5181 Encounter for therapeutic drug level monitoring: Secondary | ICD-10-CM | POA: Diagnosis not present

## 2023-05-10 DIAGNOSIS — R5381 Other malaise: Secondary | ICD-10-CM | POA: Diagnosis not present

## 2023-07-03 DIAGNOSIS — Z9181 History of falling: Secondary | ICD-10-CM | POA: Diagnosis not present

## 2023-07-03 DIAGNOSIS — J301 Allergic rhinitis due to pollen: Secondary | ICD-10-CM | POA: Diagnosis not present

## 2023-07-03 DIAGNOSIS — E782 Mixed hyperlipidemia: Secondary | ICD-10-CM | POA: Diagnosis not present

## 2023-07-03 DIAGNOSIS — G319 Degenerative disease of nervous system, unspecified: Secondary | ICD-10-CM | POA: Diagnosis not present

## 2023-07-03 DIAGNOSIS — J45909 Unspecified asthma, uncomplicated: Secondary | ICD-10-CM | POA: Diagnosis not present

## 2023-07-03 DIAGNOSIS — Z0000 Encounter for general adult medical examination without abnormal findings: Secondary | ICD-10-CM | POA: Diagnosis not present

## 2023-07-03 DIAGNOSIS — Z23 Encounter for immunization: Secondary | ICD-10-CM | POA: Diagnosis not present

## 2023-07-12 DIAGNOSIS — R7301 Impaired fasting glucose: Secondary | ICD-10-CM | POA: Diagnosis not present

## 2023-10-24 ENCOUNTER — Other Ambulatory Visit: Payer: Self-pay | Admitting: Family Medicine

## 2023-10-24 DIAGNOSIS — Z1231 Encounter for screening mammogram for malignant neoplasm of breast: Secondary | ICD-10-CM

## 2023-11-15 ENCOUNTER — Ambulatory Visit: Payer: 59

## 2023-11-26 ENCOUNTER — Ambulatory Visit
Admission: RE | Admit: 2023-11-26 | Discharge: 2023-11-26 | Disposition: A | Discharge status: Home / Self Care | Payer: 59 | Source: Ambulatory Visit | Attending: Family Medicine | Admitting: Family Medicine

## 2023-11-26 DIAGNOSIS — Z1231 Encounter for screening mammogram for malignant neoplasm of breast: Secondary | ICD-10-CM

## 2024-06-09 DIAGNOSIS — R410 Disorientation, unspecified: Secondary | ICD-10-CM | POA: Diagnosis not present

## 2024-07-07 DIAGNOSIS — Z131 Encounter for screening for diabetes mellitus: Secondary | ICD-10-CM | POA: Diagnosis not present

## 2024-07-07 DIAGNOSIS — G319 Degenerative disease of nervous system, unspecified: Secondary | ICD-10-CM | POA: Diagnosis not present

## 2024-07-07 DIAGNOSIS — Z720 Tobacco use: Secondary | ICD-10-CM | POA: Diagnosis not present

## 2024-07-07 DIAGNOSIS — J301 Allergic rhinitis due to pollen: Secondary | ICD-10-CM | POA: Diagnosis not present

## 2024-07-07 DIAGNOSIS — J45909 Unspecified asthma, uncomplicated: Secondary | ICD-10-CM | POA: Diagnosis not present

## 2024-07-07 DIAGNOSIS — Z23 Encounter for immunization: Secondary | ICD-10-CM | POA: Diagnosis not present

## 2024-07-07 DIAGNOSIS — E782 Mixed hyperlipidemia: Secondary | ICD-10-CM | POA: Diagnosis not present

## 2024-07-07 DIAGNOSIS — Z0000 Encounter for general adult medical examination without abnormal findings: Secondary | ICD-10-CM | POA: Diagnosis not present

## 2024-10-28 ENCOUNTER — Other Ambulatory Visit: Payer: Self-pay | Admitting: Family Medicine

## 2024-10-28 DIAGNOSIS — Z1231 Encounter for screening mammogram for malignant neoplasm of breast: Secondary | ICD-10-CM

## 2024-11-27 ENCOUNTER — Ambulatory Visit
